# Patient Record
Sex: Female | Born: 1935 | Race: White | Hispanic: No | Marital: Married | Smoking: Never smoker
Health system: Southern US, Community
[De-identification: ages and names within clinical notes are randomized; demographics above are authoritative.]

## PROBLEM LIST (undated history)

## (undated) DIAGNOSIS — N6019 Diffuse cystic mastopathy of unspecified breast: Secondary | ICD-10-CM

## (undated) DIAGNOSIS — E785 Hyperlipidemia, unspecified: Secondary | ICD-10-CM

## (undated) DIAGNOSIS — H3320 Serous retinal detachment, unspecified eye: Secondary | ICD-10-CM

## (undated) DIAGNOSIS — H3530 Unspecified macular degeneration: Secondary | ICD-10-CM

## (undated) DIAGNOSIS — M810 Age-related osteoporosis without current pathological fracture: Secondary | ICD-10-CM

## (undated) DIAGNOSIS — M1990 Unspecified osteoarthritis, unspecified site: Secondary | ICD-10-CM

## (undated) DIAGNOSIS — I2510 Atherosclerotic heart disease of native coronary artery without angina pectoris: Secondary | ICD-10-CM

## (undated) DIAGNOSIS — K219 Gastro-esophageal reflux disease without esophagitis: Secondary | ICD-10-CM

## (undated) HISTORY — PX: HIP SURGERY: SHX245

## (undated) HISTORY — PX: CORONARY ARTERY BYPASS GRAFT: SHX141

## (undated) HISTORY — PX: CATARACT EXTRACTION, BILATERAL: SHX1313

---

## 1968-05-20 HISTORY — PX: BREAST EXCISIONAL BIOPSY: SUR124

## 2004-03-28 ENCOUNTER — Ambulatory Visit: Payer: Self-pay | Admitting: Unknown Physician Specialty

## 2004-11-09 ENCOUNTER — Ambulatory Visit: Payer: Self-pay | Admitting: Obstetrics and Gynecology

## 2006-05-21 ENCOUNTER — Ambulatory Visit: Payer: Self-pay | Admitting: Obstetrics and Gynecology

## 2007-06-11 ENCOUNTER — Ambulatory Visit: Payer: Self-pay | Admitting: Obstetrics and Gynecology

## 2007-08-03 ENCOUNTER — Ambulatory Visit: Payer: Self-pay

## 2007-08-11 ENCOUNTER — Ambulatory Visit: Payer: Self-pay

## 2008-06-14 ENCOUNTER — Ambulatory Visit: Payer: Self-pay | Admitting: Obstetrics and Gynecology

## 2008-08-02 ENCOUNTER — Ambulatory Visit: Payer: Self-pay | Admitting: Unknown Physician Specialty

## 2009-06-26 ENCOUNTER — Ambulatory Visit: Payer: Self-pay | Admitting: Obstetrics and Gynecology

## 2009-08-11 ENCOUNTER — Ambulatory Visit: Payer: Self-pay | Admitting: Otolaryngology

## 2010-06-27 ENCOUNTER — Ambulatory Visit: Payer: Self-pay | Admitting: Obstetrics and Gynecology

## 2011-03-11 ENCOUNTER — Encounter (INDEPENDENT_AMBULATORY_CARE_PROVIDER_SITE_OTHER): Payer: Medicare Other | Admitting: Ophthalmology

## 2011-03-11 DIAGNOSIS — H3530 Unspecified macular degeneration: Secondary | ICD-10-CM

## 2011-03-11 DIAGNOSIS — H33009 Unspecified retinal detachment with retinal break, unspecified eye: Secondary | ICD-10-CM

## 2011-03-11 DIAGNOSIS — H43819 Vitreous degeneration, unspecified eye: Secondary | ICD-10-CM

## 2011-03-11 DIAGNOSIS — H33309 Unspecified retinal break, unspecified eye: Secondary | ICD-10-CM

## 2011-07-17 ENCOUNTER — Ambulatory Visit: Payer: Self-pay | Admitting: Obstetrics and Gynecology

## 2011-08-05 ENCOUNTER — Ambulatory Visit: Payer: Self-pay | Admitting: Family Medicine

## 2012-03-11 ENCOUNTER — Encounter (INDEPENDENT_AMBULATORY_CARE_PROVIDER_SITE_OTHER): Payer: Medicare Other | Admitting: Ophthalmology

## 2012-03-11 DIAGNOSIS — H43819 Vitreous degeneration, unspecified eye: Secondary | ICD-10-CM

## 2012-03-11 DIAGNOSIS — H33309 Unspecified retinal break, unspecified eye: Secondary | ICD-10-CM

## 2012-03-11 DIAGNOSIS — H3530 Unspecified macular degeneration: Secondary | ICD-10-CM

## 2012-03-11 DIAGNOSIS — H33009 Unspecified retinal detachment with retinal break, unspecified eye: Secondary | ICD-10-CM

## 2012-07-21 ENCOUNTER — Ambulatory Visit: Payer: Self-pay | Admitting: Obstetrics and Gynecology

## 2013-03-11 ENCOUNTER — Ambulatory Visit (INDEPENDENT_AMBULATORY_CARE_PROVIDER_SITE_OTHER): Payer: Medicare Other | Admitting: Ophthalmology

## 2013-03-12 ENCOUNTER — Ambulatory Visit (INDEPENDENT_AMBULATORY_CARE_PROVIDER_SITE_OTHER): Payer: Medicare Other | Admitting: Ophthalmology

## 2013-03-12 DIAGNOSIS — H33009 Unspecified retinal detachment with retinal break, unspecified eye: Secondary | ICD-10-CM

## 2013-03-12 DIAGNOSIS — H35379 Puckering of macula, unspecified eye: Secondary | ICD-10-CM

## 2013-03-12 DIAGNOSIS — H43819 Vitreous degeneration, unspecified eye: Secondary | ICD-10-CM

## 2013-03-12 DIAGNOSIS — H33309 Unspecified retinal break, unspecified eye: Secondary | ICD-10-CM

## 2013-03-12 DIAGNOSIS — H3530 Unspecified macular degeneration: Secondary | ICD-10-CM

## 2013-08-02 ENCOUNTER — Ambulatory Visit: Payer: Self-pay | Admitting: Obstetrics and Gynecology

## 2014-03-21 ENCOUNTER — Ambulatory Visit (INDEPENDENT_AMBULATORY_CARE_PROVIDER_SITE_OTHER): Payer: Medicare Other | Admitting: Ophthalmology

## 2014-03-21 DIAGNOSIS — H43813 Vitreous degeneration, bilateral: Secondary | ICD-10-CM

## 2014-03-21 DIAGNOSIS — H3531 Nonexudative age-related macular degeneration: Secondary | ICD-10-CM

## 2014-03-21 DIAGNOSIS — H35371 Puckering of macula, right eye: Secondary | ICD-10-CM

## 2014-08-24 ENCOUNTER — Ambulatory Visit
Admit: 2014-08-24 | Disposition: A | Discharge status: Home / Self Care | Payer: Self-pay | Attending: Family Medicine | Admitting: Family Medicine

## 2014-09-15 ENCOUNTER — Ambulatory Visit
Admit: 2014-09-15 | Disposition: A | Discharge status: Home / Self Care | Payer: Self-pay | Attending: Family Medicine | Admitting: Family Medicine

## 2015-01-02 ENCOUNTER — Other Ambulatory Visit: Payer: Self-pay | Admitting: Neurology

## 2015-01-02 DIAGNOSIS — R51 Headache: Principal | ICD-10-CM

## 2015-01-02 DIAGNOSIS — R519 Headache, unspecified: Secondary | ICD-10-CM

## 2015-01-06 ENCOUNTER — Ambulatory Visit: Payer: Medicare Other

## 2015-01-17 ENCOUNTER — Ambulatory Visit
Admission: RE | Admit: 2015-01-17 | Discharge: 2015-01-17 | Disposition: A | Discharge status: Home / Self Care | Payer: Medicare Other | Source: Ambulatory Visit | Attending: Neurology | Admitting: Neurology

## 2015-01-17 DIAGNOSIS — R519 Headache, unspecified: Secondary | ICD-10-CM

## 2015-01-17 DIAGNOSIS — R51 Headache: Secondary | ICD-10-CM | POA: Insufficient documentation

## 2015-01-17 DIAGNOSIS — R55 Syncope and collapse: Secondary | ICD-10-CM | POA: Insufficient documentation

## 2015-01-17 DIAGNOSIS — I739 Peripheral vascular disease, unspecified: Secondary | ICD-10-CM | POA: Diagnosis not present

## 2015-01-17 DIAGNOSIS — R42 Dizziness and giddiness: Secondary | ICD-10-CM | POA: Insufficient documentation

## 2015-03-29 ENCOUNTER — Ambulatory Visit (INDEPENDENT_AMBULATORY_CARE_PROVIDER_SITE_OTHER): Payer: Medicare Other | Admitting: Ophthalmology

## 2015-03-29 DIAGNOSIS — H353122 Nonexudative age-related macular degeneration, left eye, intermediate dry stage: Secondary | ICD-10-CM

## 2015-03-29 DIAGNOSIS — H33301 Unspecified retinal break, right eye: Secondary | ICD-10-CM

## 2015-03-29 DIAGNOSIS — H338 Other retinal detachments: Secondary | ICD-10-CM

## 2015-03-29 DIAGNOSIS — H35371 Puckering of macula, right eye: Secondary | ICD-10-CM | POA: Diagnosis not present

## 2015-03-29 DIAGNOSIS — H43813 Vitreous degeneration, bilateral: Secondary | ICD-10-CM | POA: Diagnosis not present

## 2015-08-29 ENCOUNTER — Other Ambulatory Visit: Payer: Self-pay | Admitting: Family Medicine

## 2015-08-29 DIAGNOSIS — Z1231 Encounter for screening mammogram for malignant neoplasm of breast: Secondary | ICD-10-CM

## 2015-09-07 ENCOUNTER — Ambulatory Visit
Admission: RE | Admit: 2015-09-07 | Discharge: 2015-09-07 | Disposition: A | Discharge status: Home / Self Care | Payer: Medicare Other | Source: Ambulatory Visit | Attending: Family Medicine | Admitting: Family Medicine

## 2015-09-07 ENCOUNTER — Other Ambulatory Visit: Payer: Self-pay | Admitting: Family Medicine

## 2015-09-07 DIAGNOSIS — Z1231 Encounter for screening mammogram for malignant neoplasm of breast: Secondary | ICD-10-CM

## 2016-04-03 ENCOUNTER — Ambulatory Visit (INDEPENDENT_AMBULATORY_CARE_PROVIDER_SITE_OTHER): Payer: Medicare Other | Admitting: Ophthalmology

## 2016-04-03 DIAGNOSIS — H353122 Nonexudative age-related macular degeneration, left eye, intermediate dry stage: Secondary | ICD-10-CM | POA: Diagnosis not present

## 2016-04-03 DIAGNOSIS — H43813 Vitreous degeneration, bilateral: Secondary | ICD-10-CM

## 2016-04-03 DIAGNOSIS — H33301 Unspecified retinal break, right eye: Secondary | ICD-10-CM | POA: Diagnosis not present

## 2016-04-03 DIAGNOSIS — H35371 Puckering of macula, right eye: Secondary | ICD-10-CM | POA: Diagnosis not present

## 2016-04-03 DIAGNOSIS — H338 Other retinal detachments: Secondary | ICD-10-CM

## 2016-06-14 NOTE — Progress Notes (Signed)
Scheduling pre op- please add SURGICAL ORDERS IN EPIC  thanks 

## 2016-06-19 ENCOUNTER — Encounter (HOSPITAL_COMMUNITY): Payer: Self-pay

## 2016-06-20 ENCOUNTER — Other Ambulatory Visit (HOSPITAL_COMMUNITY): Payer: Self-pay | Admitting: Emergency Medicine

## 2016-06-24 ENCOUNTER — Encounter (HOSPITAL_COMMUNITY)
Admission: RE | Admit: 2016-06-24 | Discharge: 2016-06-24 | Disposition: A | Discharge status: Home / Self Care | Payer: Medicare Other | Source: Ambulatory Visit | Attending: Orthopedic Surgery | Admitting: Orthopedic Surgery

## 2016-06-24 ENCOUNTER — Encounter (HOSPITAL_COMMUNITY): Payer: Self-pay

## 2016-06-24 DIAGNOSIS — Z01812 Encounter for preprocedural laboratory examination: Secondary | ICD-10-CM | POA: Diagnosis present

## 2016-06-24 HISTORY — DX: Serous retinal detachment, unspecified eye: H33.20

## 2016-06-24 HISTORY — DX: Unspecified osteoarthritis, unspecified site: M19.90

## 2016-06-24 HISTORY — DX: Unspecified macular degeneration: H35.30

## 2016-06-24 LAB — SURGICAL PCR SCREEN
MRSA, PCR: NEGATIVE
Staphylococcus aureus: NEGATIVE

## 2016-06-24 LAB — CBC
HEMATOCRIT: 37.7 % (ref 36.0–46.0)
Hemoglobin: 12.3 g/dL (ref 12.0–15.0)
MCH: 28.5 pg (ref 26.0–34.0)
MCHC: 32.6 g/dL (ref 30.0–36.0)
MCV: 87.3 fL (ref 78.0–100.0)
Platelets: 266 10*3/uL (ref 150–400)
RBC: 4.32 MIL/uL (ref 3.87–5.11)
RDW: 12.7 % (ref 11.5–15.5)
WBC: 4.7 10*3/uL (ref 4.0–10.5)

## 2016-06-24 LAB — ABO/RH: ABO/RH(D): B POS

## 2016-06-24 NOTE — Progress Notes (Addendum)
At pre-op patient reports she has not talked with surgeon or PA about her surgery and will "swing by his office after this" to do so. She will need to sign her consent D.O.S

## 2016-06-24 NOTE — Patient Instructions (Signed)
Darlene Barry Darlene Barry  06/24/2016   Your procedure is scheduled on: 07-02-16  [facility]  [facility] to 3rd floor to  Short Stay Center at [phone].  Call this number if you have problems the morning of surgery [phone]   Remember: ONLY 1 PERSON MAY GO WITH YOU TO SHORT STAY TO GET  READY MORNING OF YOUR SURGERY.  Do not eat food or drink liquids :After Midnight.     Take these medicines the morning of surgery with A SIP OF WATER: tylenol as needed, gabapentin(Neurontin)                                You may not have any metal on your body including hair pins and              piercings  Do not wear jewelry, make-up, lotions, powders or perfumes, deodorant             Do not wear nail polish.  Do not shave  48 hours prior to surgery.              Men may shave face and neck.   Do not bring valuables to the hospital. [facility] IS NOT             RESPONSIBLE   FOR VALUABLES.  Contacts, dentures or bridgework may not be worn into surgery.  Leave suitcase in the car. After surgery it may be brought to your room.              Please read over the following fact sheets you were given: _____________________________________________________________________             [facility] Pre[facility] Before surgery, you can play an important role.  Because skin is not sterile, your skin needs to be as free of germs as possible.  You can reduce the number of germs on your skin by washing with CHG (chlorahexidine gluconate) soap before surgery.  CHG is an antiseptic cleaner which kills germs and bonds with the skin to continue killing germs even after washing. Please DO NOT use if you have an allergy to CHG or antibacterial soaps.  If your skin becomes reddened/irritated stop using the CHG and inform your nurse when you arrive at Short Stay. Do not shave (including legs and underarms) for at least 48 hours prior to the first CHG shower.   You may shave your face/neck. Please follow these instructions carefully:  1.  Shower with CHG Soap the night before surgery and the  morning of Surgery.  2.  If you choose to wash your hair, wash your hair first as usual with your  normal  shampoo.  3.  After you shampoo, rinse your hair and body thoroughly to remove the  shampoo.                           4.  Use CHG as you would any other liquid soap.  You can apply chg directly  to the skin and wash                       Gently with a scrungie or clean washcloth.  5.  Apply the CHG Soap to your  body ONLY FROM THE NECK DOWN.   Do not use on face/ open                           Wound or open sores. Avoid contact with eyes, ears mouth and genitals (private parts).                       Wash face,  Genitals (private parts) with your normal soap.             6.  Wash thoroughly, paying special attention to the area where your surgery  will be performed.  7.  Thoroughly rinse your body with warm water from the neck down.  8.  DO NOT shower/wash with your normal soap after using and rinsing off  the CHG Soap.                9.  Pat yourself dry with a clean towel.            10.  Wear clean pajamas.            11.  Place clean sheets on your bed the night of your first shower and do not  sleep with pets. Day of Surgery : Do not apply any lotions/deodorants the morning of surgery.  Please wear clean clothes to the hospital/surgery center.  FAILURE TO FOLLOW THESE INSTRUCTIONS MAY RESULT IN THE CANCELLATION OF YOUR SURGERY PATIENT SIGNATURE_________________________________  NURSE SIGNATURE__________________________________  ________________________________________________________________________   Darlene Barry  An incentive spirometer is a tool that can help keep your lungs clear and active. This tool measures how well you are filling your lungs with each breath. Taking long deep breaths may help reverse or decrease the chance of  developing breathing (pulmonary) problems (especially infection) following:  A long period of time when you are unable to move or be active. BEFORE THE PROCEDURE   If the spirometer includes an indicator to show your best effort, your nurse or respiratory therapist will set it to a desired goal.  If possible, sit up straight or lean slightly forward. Try not to slouch.  Hold the incentive spirometer in an upright position. INSTRUCTIONS FOR USE  1. Sit on the edge of your bed if possible, or sit up as far as you can in bed or on a chair. 2. Hold the incentive spirometer in an upright position. 3. Breathe out normally. 4. Place the mouthpiece in your mouth and seal your lips tightly around it. 5. Breathe in slowly and as deeply as possible, raising the piston or the ball toward the top of the column. 6. Hold your breath for 3-5 seconds or for as long as possible. Allow the piston or ball to fall to the bottom of the column. 7. Remove the mouthpiece from your mouth and breathe out normally. 8. Rest for a few seconds and repeat Steps 1 through 7 at least 10 times every 1-2 hours when you are awake. Take your time and take a few normal breaths between deep breaths. 9. The spirometer may include an indicator to show your best effort. Use the indicator as a goal to work toward during each repetition. 10. After each set of 10 deep breaths, practice coughing to be sure your lungs are clear. If you have an incision (the cut made at the time of surgery), support your incision when coughing by placing a pillow or rolled up towels firmly against it.  Once you are able to get out of bed, walk around indoors and cough well. You may stop using the incentive spirometer when instructed by your caregiver.  RISKS AND COMPLICATIONS  Take your time so you do not get dizzy or light-headed.  If you are in pain, you may need to take or ask for pain medication before doing incentive spirometry. It is harder to take a  deep breath if you are having pain. AFTER USE  Rest and breathe slowly and easily.  It can be helpful to keep track of a log of your progress. Your caregiver can provide you with a simple table to help with this. If you are using the spirometer at home, follow these instructions: [place] IF:   You are having difficultly using the spirometer.  You have trouble using the spirometer as often as instructed.  Your pain medication is not giving enough relief while using the spirometer.  You develop fever of 100.5 F (38.1 C) or higher. SEEK IMMEDIATE MEDICAL CARE IF:   You cough up bloody sputum that had not been present before.  You develop fever of 102 F (38.9 C) or greater.  You develop worsening pain at or near the incision site. MAKE SURE YOU:   Understand these instructions.  Will watch your condition.  Will get help right away if you are not doing well or get worse. Document Released: 09/16/2006 Document Revised: 07/29/2011 Document Reviewed: 11/17/2006 Exit[employer] Patient Information 2014 Exit[employer], [place].   ________________________________________________________________________  WHAT IS A BLOOD TRANSFUSION? Blood Transfusion Information  A transfusion is the replacement of blood or some of its parts. Blood is made up of multiple cells which provide different functions.  Red blood cells carry oxygen and are used for blood loss replacement.  White blood cells fight against infection.  Platelets control bleeding.  Plasma helps clot blood.  Other blood products are available for specialized needs, such as hemophilia or other clotting disorders. BEFORE THE TRANSFUSION  Who gives blood for transfusions?   Healthy volunteers who are fully evaluated to make sure their blood is safe. This is blood bank blood. Transfusion therapy is the safest it has ever been in the practice of medicine. Before blood is taken from a donor, a complete history is taken to make  sure that person has no history of diseases nor engages in risky social behavior (examples are intravenous drug use or sexual activity with multiple partners). The donor's travel history is screened to minimize risk of transmitting infections, such as malaria. The donated blood is tested for signs of infectious diseases, such as HIV and hepatitis. The blood is then tested to be sure it is compatible with you in order to minimize the chance of a transfusion reaction. If you or a relative donates blood, this is often done in anticipation of surgery and is not appropriate for emergency situations. It takes many days to process the donated blood. RISKS AND COMPLICATIONS Although transfusion therapy is very safe and saves many lives, the main dangers of transfusion include:   Getting an infectious disease.  Developing a transfusion reaction. This is an allergic reaction to something in the blood you were given. Every precaution is taken to prevent this. The decision to have a blood transfusion has been considered carefully by your caregiver before blood is given. Blood is not given unless the benefits outweigh the risks. AFTER THE TRANSFUSION  Right after receiving a blood transfusion, you will usually feel much better and more energetic.  This is especially true if your red blood cells have gotten low (anemic). The transfusion raises the level of the red blood cells which carry oxygen, and this usually causes an energy increase.  The nurse administering the transfusion will monitor you carefully for complications. HOME CARE INSTRUCTIONS  No special instructions are needed after a transfusion. You may find your energy is better. Speak with your caregiver about any limitations on activity for underlying diseases you may have. SEEK MEDICAL CARE IF:   Your condition is not improving after your transfusion.  You develop redness or irritation at the intravenous (IV) site. SEEK IMMEDIATE MEDICAL CARE IF:   Any of the following symptoms occur over the next 12 hours:  Shaking chills.  You have a temperature by mouth above 102 F (38.9 C), not controlled by medicine.  Chest, back, or muscle pain.  People around you feel you are not acting correctly or are confused.  Shortness of breath or difficulty breathing.  Dizziness and fainting.  You get a rash or develop hives.  You have a decrease in urine output.  Your urine turns a dark color or changes to pink, red, or brown. Any of the following symptoms occur over the next 10 days:  You have a temperature by mouth above 102 F (38.9 C), not controlled by medicine.  Shortness of breath.  Weakness after normal activity.  The white part of the eye turns yellow (jaundice).  You have a decrease in the amount of urine or are urinating less often.  Your urine turns a dark color or changes to pink, red, or brown. Document Released: 05/03/2000 Document Revised: 07/29/2011 Document Reviewed: 12/21/2007 [facility] Patient Information 2014 [place], [place].  _______________________________________________________________________

## 2016-06-27 NOTE — H&P (Signed)
TOTAL HIP ADMISSION H&P  Patient is admitted for left total hip arthroplasty, anterior approach.  Subjective:  Chief Complaint:    Left hip primary OA / pain  HPI: Darlene Barry, 83 y.o. female, has a history of pain and functional disability in the left hip(s) due to arthritis and patient has failed non-surgical conservative treatments for greater than 12 weeks to include NSAID's and/or analgesics and activity modification.  Onset of symptoms was gradual starting  years ago with gradually worsening course since that time.The patient noted no past surgery on the left hip(s).  Patient currently rates pain in the left hip at 9 out of 10 with activity. Patient has worsening of pain with activity and weight bearing, trendelenberg gait, pain that interfers with activities of daily living and pain with passive range of motion. Patient has evidence of periarticular osteophytes and joint space narrowing by imaging studies. This condition presents safety issues increasing the risk of falls. There is no current active infection.   Risks, benefits and expectations were discussed with the patient.  Risks including but not limited to the risk of anesthesia, blood clots, nerve damage, blood vessel damage, failure of the prosthesis, infection and up to and including death.  Patient understand the risks, benefits and expectations and wishes to proceed with surgery.    PCP: Jerl Mina, MD  D/C Plans:       Home   Post-op Meds:       No Rx given   Tranexamic Acid:      To be given - IV   Decadron:      Is to be given  FYI:     ASA  Norco    Past Medical History:  Diagnosis Date  . Arthritis   . Dementia   . Detached retina   . Macular degeneration     Past Surgical History:  Procedure Laterality Date  . BREAST EXCISIONAL BIOPSY Right 1970   NEG  . CATARACT EXTRACTION, BILATERAL    . HIP SURGERY     fractured hip, has screws in place    No prescriptions prior to admission.   Allergies   Allergen Reactions  . Sulfa Antibiotics Other (See Comments)    Social History  Substance Use Topics  . Smoking status: Never Smoker  . Smokeless tobacco: Never Used  . Alcohol use No    Family History  Problem Relation Age of Onset  . Breast cancer Daughter 32  . Breast cancer Paternal Aunt   . Breast cancer Paternal Aunt      Review of Systems  Constitutional: Negative.   HENT: Negative.   Eyes: Negative.   Respiratory: Negative.   Cardiovascular: Negative.   Gastrointestinal: Negative.   Genitourinary: Negative.   Musculoskeletal: Positive for joint pain.  Skin: Negative.   Neurological: Negative.   Endo/Heme/Allergies: Negative.   Psychiatric/Behavioral: Positive for memory loss.    Objective:  Physical Exam  Constitutional: She is oriented to person, place, and time. She appears well-developed.  HENT:  Head: Normocephalic.  Eyes: Pupils are equal, round, and reactive to light.  Neck: Neck supple. No JVD present. No tracheal deviation present. No thyromegaly present.  Cardiovascular: Normal rate, regular rhythm and intact distal pulses.   Respiratory: Effort normal and breath sounds normal. No respiratory distress. She has no wheezes.  GI: Soft. There is no tenderness. There is no guarding.  Musculoskeletal:       Left hip: She exhibits decreased range of motion, decreased strength, tenderness  and bony tenderness. She exhibits no swelling, no deformity and no laceration.  Lymphadenopathy:    She has no cervical adenopathy.  Neurological: She is alert and oriented to person, place, and time.  Skin: Skin is warm and dry.  Psychiatric: She has a normal mood and affect.       Labs:  Estimated body mass index is 22.64 kg/m as calculated from the following:   Height as of 06/24/16: 5\' 3"  (1.6 m).   Weight as of 06/24/16: 58 kg (127 lb 12.8 oz).   Imaging Review Plain radiographs demonstrate severe degenerative joint disease of the left hip(s). The bone  quality appears to be good for age and reported activity level.  Assessment/Plan:  End stage arthritis, left hip(s)  The patient history, physical examination, clinical judgement of the provider and imaging studies are consistent with end stage degenerative joint disease of the left hip(s) and total hip arthroplasty is deemed medically necessary. The treatment options including medical management, injection therapy, arthroscopy and arthroplasty were discussed at length. The risks and benefits of total hip arthroplasty were presented and reviewed. The risks due to aseptic loosening, infection, stiffness, dislocation/subluxation,  thromboembolic complications and other imponderables were discussed.  The patient acknowledged the explanation, agreed to proceed with the plan and consent was signed. Patient is being admitted for inpatient treatment for surgery, pain control, PT, OT, prophylactic antibiotics, VTE prophylaxis, progressive ambulation and ADL's and discharge planning.The patient is planning to be discharged home.     Anastasio AuerbachMatthew S. Domonique Cothran   PA-C  06/27/2016, 11:33 AM

## 2016-07-02 ENCOUNTER — Encounter (HOSPITAL_COMMUNITY)
Admission: RE | Disposition: A | Discharge status: Home / Self Care | Payer: Self-pay | Source: Ambulatory Visit | Attending: Orthopedic Surgery

## 2016-07-02 ENCOUNTER — Inpatient Hospital Stay (HOSPITAL_COMMUNITY): Payer: Medicare Other | Admitting: Certified Registered Nurse Anesthetist

## 2016-07-02 ENCOUNTER — Inpatient Hospital Stay (HOSPITAL_COMMUNITY): Payer: Medicare Other

## 2016-07-02 ENCOUNTER — Encounter (HOSPITAL_COMMUNITY): Payer: Self-pay | Admitting: *Deleted

## 2016-07-02 ENCOUNTER — Inpatient Hospital Stay (HOSPITAL_COMMUNITY)
Admission: RE | Admit: 2016-07-02 | Discharge: 2016-07-04 | DRG: 470 | Disposition: A | Discharge status: Home / Self Care | Payer: Medicare Other | Source: Ambulatory Visit | Attending: Orthopedic Surgery | Admitting: Orthopedic Surgery

## 2016-07-02 DIAGNOSIS — Z882 Allergy status to sulfonamides status: Secondary | ICD-10-CM

## 2016-07-02 DIAGNOSIS — F0390 Unspecified dementia without behavioral disturbance: Secondary | ICD-10-CM | POA: Diagnosis present

## 2016-07-02 DIAGNOSIS — R110 Nausea: Secondary | ICD-10-CM | POA: Diagnosis not present

## 2016-07-02 DIAGNOSIS — Z96649 Presence of unspecified artificial hip joint: Secondary | ICD-10-CM

## 2016-07-02 DIAGNOSIS — M1612 Unilateral primary osteoarthritis, left hip: Principal | ICD-10-CM | POA: Diagnosis present

## 2016-07-02 DIAGNOSIS — H3530 Unspecified macular degeneration: Secondary | ICD-10-CM | POA: Diagnosis present

## 2016-07-02 DIAGNOSIS — Z96642 Presence of left artificial hip joint: Secondary | ICD-10-CM

## 2016-07-02 HISTORY — PX: TOTAL HIP ARTHROPLASTY: SHX124

## 2016-07-02 LAB — TYPE AND SCREEN
ABO/RH(D): B POS
ANTIBODY SCREEN: NEGATIVE

## 2016-07-02 SURGERY — ARTHROPLASTY, HIP, TOTAL, ANTERIOR APPROACH
Anesthesia: Spinal | Site: Hip | Laterality: Left

## 2016-07-02 MED ORDER — CHLORHEXIDINE GLUCONATE 4 % EX LIQD: 60.0000 mL | Freq: Once | CUTANEOUS | Status: DC

## 2016-07-02 MED ORDER — BUPIVACAINE HCL (PF) 0.75 % IJ SOLN
INTRAMUSCULAR | Status: DC | PRN
  Administered 2016-07-02: 2 mL

## 2016-07-02 MED ORDER — MENTHOL 3 MG MT LOZG: 1.0000 | LOZENGE | OROMUCOSAL | Status: DC | PRN

## 2016-07-02 MED ORDER — FENTANYL CITRATE (PF) 100 MCG/2ML IJ SOLN
25.0000 ug | INTRAMUSCULAR | Status: DC | PRN
  Administered 2016-07-02 (×2): 50 ug via INTRAVENOUS

## 2016-07-02 MED ORDER — SODIUM CHLORIDE 0.9 % IV SOLN
100.0000 mL/h | INTRAVENOUS | Status: DC
  Administered 2016-07-02 – 2016-07-03 (×2): 100 mL/h via INTRAVENOUS
  Filled 2016-07-02 (×7): qty 1000

## 2016-07-02 MED ORDER — FENTANYL CITRATE (PF) 100 MCG/2ML IJ SOLN
INTRAMUSCULAR | Status: AC
  Administered 2016-07-02: 50 ug via INTRAVENOUS
  Filled 2016-07-02: qty 2

## 2016-07-02 MED ORDER — METOCLOPRAMIDE HCL 5 MG/ML IJ SOLN
5.0000 mg | Freq: Three times a day (TID) | INTRAMUSCULAR | Status: DC | PRN
  Administered 2016-07-02: 16:00:00 10 mg via INTRAVENOUS
  Filled 2016-07-02: qty 2

## 2016-07-02 MED ORDER — ONDANSETRON HCL 4 MG PO TABS
4.0000 mg | ORAL_TABLET | Freq: Four times a day (QID) | ORAL | Status: DC | PRN
  Administered 2016-07-03 (×2): 4 mg via ORAL
  Filled 2016-07-02 (×2): qty 1

## 2016-07-02 MED ORDER — DEXAMETHASONE SODIUM PHOSPHATE 10 MG/ML IJ SOLN
10.0000 mg | Freq: Once | INTRAMUSCULAR | Status: AC
  Administered 2016-07-03: 10 mg via INTRAVENOUS
  Filled 2016-07-02: qty 1

## 2016-07-02 MED ORDER — PROPOFOL 500 MG/50ML IV EMUL
INTRAVENOUS | Status: DC | PRN
  Administered 2016-07-02: 80 ug/kg/min via INTRAVENOUS

## 2016-07-02 MED ORDER — ASPIRIN 81 MG PO CHEW
81.0000 mg | CHEWABLE_TABLET | Freq: Two times a day (BID) | ORAL | Status: DC
  Administered 2016-07-03 – 2016-07-04 (×3): 81 mg via ORAL
  Filled 2016-07-02 (×3): qty 1

## 2016-07-02 MED ORDER — DIPHENHYDRAMINE HCL 25 MG PO CAPS: 25.0000 mg | ORAL_CAPSULE | Freq: Four times a day (QID) | ORAL | Status: DC | PRN

## 2016-07-02 MED ORDER — PROPOFOL 10 MG/ML IV BOLUS
INTRAVENOUS | Status: AC
  Filled 2016-07-02: qty 60

## 2016-07-02 MED ORDER — ALUM & MAG HYDROXIDE-SIMETH 200-200-20 MG/5ML PO SUSP: 30.0000 mL | ORAL | Status: DC | PRN

## 2016-07-02 MED ORDER — PROPOFOL 500 MG/50ML IV EMUL
INTRAVENOUS | Status: DC | PRN
  Administered 2016-07-02: 40 mg via INTRAVENOUS

## 2016-07-02 MED ORDER — LACTATED RINGERS IV SOLN
INTRAVENOUS | Status: DC
  Administered 2016-07-02 (×3): via INTRAVENOUS

## 2016-07-02 MED ORDER — MAGNESIUM CITRATE PO SOLN: 1.0000 | Freq: Once | ORAL | Status: DC | PRN

## 2016-07-02 MED ORDER — METHOCARBAMOL 500 MG PO TABS: 500.0000 mg | ORAL_TABLET | Freq: Four times a day (QID) | ORAL | Status: DC | PRN

## 2016-07-02 MED ORDER — DEXAMETHASONE SODIUM PHOSPHATE 10 MG/ML IJ SOLN
INTRAMUSCULAR | Status: AC
  Filled 2016-07-02: qty 1

## 2016-07-02 MED ORDER — CEFAZOLIN SODIUM-DEXTROSE 2-4 GM/100ML-% IV SOLN
2.0000 g | Freq: Four times a day (QID) | INTRAVENOUS | Status: AC
  Administered 2016-07-02 – 2016-07-03 (×2): 2 g via INTRAVENOUS
  Filled 2016-07-02 (×2): qty 100

## 2016-07-02 MED ORDER — STERILE WATER FOR IRRIGATION IR SOLN
Status: DC | PRN
  Administered 2016-07-02: 2000 mL

## 2016-07-02 MED ORDER — GABAPENTIN 100 MG PO CAPS
200.0000 mg | ORAL_CAPSULE | Freq: Three times a day (TID) | ORAL | Status: DC
  Administered 2016-07-03 – 2016-07-04 (×5): 200 mg via ORAL
  Filled 2016-07-02 (×6): qty 2

## 2016-07-02 MED ORDER — HYDROMORPHONE HCL 1 MG/ML IJ SOLN: 0.2500 mg | INTRAMUSCULAR | Status: DC | PRN

## 2016-07-02 MED ORDER — HYDROCODONE-ACETAMINOPHEN 7.5-325 MG PO TABS: 1.0000 | ORAL_TABLET | ORAL | Status: DC

## 2016-07-02 MED ORDER — HYDROMORPHONE HCL 1 MG/ML IJ SOLN
0.5000 mg | INTRAMUSCULAR | Status: DC | PRN
  Administered 2016-07-02: 17:00:00 0.5 mg via INTRAVENOUS
  Filled 2016-07-02: qty 1

## 2016-07-02 MED ORDER — ONDANSETRON HCL 4 MG/2ML IJ SOLN
4.0000 mg | Freq: Four times a day (QID) | INTRAMUSCULAR | Status: DC | PRN
  Administered 2016-07-02: 4 mg via INTRAVENOUS
  Filled 2016-07-02: qty 2

## 2016-07-02 MED ORDER — METHOCARBAMOL 1000 MG/10ML IJ SOLN
500.0000 mg | Freq: Four times a day (QID) | INTRAVENOUS | Status: DC | PRN
  Administered 2016-07-02: 500 mg via INTRAVENOUS
  Filled 2016-07-02: qty 5
  Filled 2016-07-02: qty 550

## 2016-07-02 MED ORDER — TRANEXAMIC ACID 1000 MG/10ML IV SOLN
1000.0000 mg | INTRAVENOUS | Status: AC
  Administered 2016-07-02: 1000 mg via INTRAVENOUS
  Filled 2016-07-02: qty 10

## 2016-07-02 MED ORDER — CEFAZOLIN SODIUM-DEXTROSE 2-4 GM/100ML-% IV SOLN
INTRAVENOUS | Status: AC
  Filled 2016-07-02: qty 100

## 2016-07-02 MED ORDER — DEXAMETHASONE SODIUM PHOSPHATE 10 MG/ML IJ SOLN
10.0000 mg | Freq: Once | INTRAMUSCULAR | Status: AC
  Administered 2016-07-02: 10 mg via INTRAVENOUS

## 2016-07-02 MED ORDER — METOCLOPRAMIDE HCL 5 MG PO TABS: 5.0000 mg | ORAL_TABLET | Freq: Three times a day (TID) | ORAL | Status: DC | PRN

## 2016-07-02 MED ORDER — ESOMEPRAZOLE MAGNESIUM 40 MG PO CPDR
40.0000 mg | DELAYED_RELEASE_CAPSULE | Freq: Two times a day (BID) | ORAL | Status: DC
  Administered 2016-07-03 – 2016-07-04 (×3): 40 mg via ORAL
  Filled 2016-07-02 (×7): qty 1

## 2016-07-02 MED ORDER — BISACODYL 10 MG RE SUPP: 10.0000 mg | Freq: Every day | RECTAL | Status: DC | PRN

## 2016-07-02 MED ORDER — DOCUSATE SODIUM 100 MG PO CAPS
100.0000 mg | ORAL_CAPSULE | Freq: Two times a day (BID) | ORAL | Status: DC
  Administered 2016-07-03 – 2016-07-04 (×3): 100 mg via ORAL
  Filled 2016-07-02 (×4): qty 1

## 2016-07-02 MED ORDER — PHENOL 1.4 % MT LIQD: 1.0000 | OROMUCOSAL | Status: DC | PRN

## 2016-07-02 MED ORDER — FERROUS SULFATE 325 (65 FE) MG PO TABS
325.0000 mg | ORAL_TABLET | Freq: Three times a day (TID) | ORAL | Status: DC
  Administered 2016-07-04: 325 mg via ORAL
  Filled 2016-07-02: qty 1

## 2016-07-02 MED ORDER — FENTANYL CITRATE (PF) 100 MCG/2ML IJ SOLN
INTRAMUSCULAR | Status: DC | PRN
  Administered 2016-07-02: 100 ug via INTRAVENOUS

## 2016-07-02 MED ORDER — POLYETHYLENE GLYCOL 3350 17 G PO PACK
17.0000 g | PACK | Freq: Two times a day (BID) | ORAL | Status: DC
  Administered 2016-07-03 – 2016-07-04 (×2): 17 g via ORAL
  Filled 2016-07-02 (×2): qty 1

## 2016-07-02 MED ORDER — CEFAZOLIN SODIUM-DEXTROSE 2-4 GM/100ML-% IV SOLN
2.0000 g | INTRAVENOUS | Status: AC
  Administered 2016-07-02: 2 g via INTRAVENOUS

## 2016-07-02 MED ORDER — NON FORMULARY: 40.0000 mg | Freq: Two times a day (BID) | Status: DC

## 2016-07-02 MED ORDER — SODIUM CHLORIDE 0.9 % IR SOLN
Status: DC | PRN
  Administered 2016-07-02: 1000 mL

## 2016-07-02 MED ORDER — FENTANYL CITRATE (PF) 100 MCG/2ML IJ SOLN
INTRAMUSCULAR | Status: AC
  Filled 2016-07-02: qty 2

## 2016-07-02 SURGICAL SUPPLY — 34 items
ADH SKN CLS APL DERMABOND .7 (GAUZE/BANDAGES/DRESSINGS) ×1
BAG SPEC THK2 15X12 ZIP CLS (MISCELLANEOUS) ×1
BAG ZIPLOCK 12X15 (MISCELLANEOUS) ×2 IMPLANT
BLADE SAG 18X100X1.27 (BLADE) ×3 IMPLANT
CAPT HIP TOTAL 2 ×2 IMPLANT
CLOTH BEACON ORANGE TIMEOUT ST (SAFETY) ×3 IMPLANT
COVER PERINEAL POST (MISCELLANEOUS) ×3 IMPLANT
DERMABOND ADVANCED (GAUZE/BANDAGES/DRESSINGS) ×2
DERMABOND ADVANCED .7 DNX12 (GAUZE/BANDAGES/DRESSINGS) ×1 IMPLANT
DRAPE STERI IOBAN 125X83 (DRAPES) ×3 IMPLANT
DRAPE U-SHAPE 47X51 STRL (DRAPES) ×6 IMPLANT
DRESSING AQUACEL AG SP 3.5X10 (GAUZE/BANDAGES/DRESSINGS) ×1 IMPLANT
DRSG AQUACEL AG ADV 3.5X10 (GAUZE/BANDAGES/DRESSINGS) ×2 IMPLANT
DRSG AQUACEL AG SP 3.5X10 (GAUZE/BANDAGES/DRESSINGS) ×3
DURAPREP 26ML APPLICATOR (WOUND CARE) ×3 IMPLANT
ELECT REM PT RETURN 9FT ADLT (ELECTROSURGICAL) ×3
ELECTRODE REM PT RTRN 9FT ADLT (ELECTROSURGICAL) ×1 IMPLANT
GLOVE BIO SURGEON STRL SZ7 (GLOVE) ×2 IMPLANT
GLOVE BIOGEL M STRL SZ7.5 (GLOVE) ×4 IMPLANT
GLOVE BIOGEL PI IND STRL 7.5 (GLOVE) ×1 IMPLANT
GLOVE BIOGEL PI INDICATOR 7.5 (GLOVE) ×12
GLOVE ORTHO TXT STRL SZ7.5 (GLOVE) ×3 IMPLANT
GOWN STRL REUS W/ TWL XL LVL3 (GOWN DISPOSABLE) IMPLANT
GOWN STRL REUS W/TWL LRG LVL3 (GOWN DISPOSABLE) ×3 IMPLANT
GOWN STRL REUS W/TWL XL LVL3 (GOWN DISPOSABLE) ×8 IMPLANT
HOLDER FOLEY CATH W/STRAP (MISCELLANEOUS) ×3 IMPLANT
PACK ANTERIOR HIP CUSTOM (KITS) ×3 IMPLANT
SUT MNCRL AB 4-0 PS2 18 (SUTURE) ×3 IMPLANT
SUT VIC AB 1 CT1 36 (SUTURE) ×9 IMPLANT
SUT VIC AB 2-0 CT1 27 (SUTURE) ×6
SUT VIC AB 2-0 CT1 TAPERPNT 27 (SUTURE) ×2 IMPLANT
SUT VLOC 180 0 24IN GS25 (SUTURE) ×3 IMPLANT
TRAY FOLEY CATH SILVER 14FR (SET/KITS/TRAYS/PACK) ×2 IMPLANT
YANKAUER SUCT BULB TIP 10FT TU (MISCELLANEOUS) ×2 IMPLANT

## 2016-07-02 NOTE — Discharge Instructions (Signed)

## 2016-07-02 NOTE — Interval H&P Note (Signed)
History and Physical Interval Note:  07/02/2016 9:57 AM  Darlene Barry  has presented today for surgery, with the diagnosis of Left hip osteoarthritis  The various methods of treatment have been discussed with the patient and family. After consideration of risks, benefits and other options for treatment, the patient has consented to  Procedure(s): LEFT TOTAL HIP ARTHROPLASTY ANTERIOR APPROACH (Left) as a surgical intervention .  The patient's history has been reviewed, patient examined, no change in status, stable for surgery.  I have reviewed the patient's chart and labs.  Questions were answered to the patient's satisfaction.     Shelda PalLIN,Oliana Gowens D

## 2016-07-02 NOTE — Transfer of Care (Signed)
Immediate Anesthesia Transfer of Care Note  Patient: Kittie PlaterMary E Subramaniam  Procedure(s) Performed: Procedure(s): LEFT TOTAL HIP ARTHROPLASTY ANTERIOR APPROACH (Left)  Patient Location: PACU  Anesthesia Type:Spinal  Level of Consciousness: awake, alert  and oriented  Airway & Oxygen Therapy: Patient Spontanous Breathing and Patient connected to nasal cannula oxygen  Post-op Assessment: Report given to RN and Post -op Vital signs reviewed and stable  Post vital signs: Reviewed and stable  Last Vitals:  Vitals:   07/02/16 0807  BP: (!) 142/62  Pulse: 95  Resp: 18    Last Pain:  Vitals:   07/02/16 0807  TempSrc: Oral      Patients Stated Pain Goal: 3 (07/02/16 0851)  Complications: No apparent anesthesia complications

## 2016-07-02 NOTE — Op Note (Signed)
NAME:  Darlene Barry                ACCOUNT NO.: [id number]      MEDICAL RECORD NO.: [id number]      FACILITY:  [facility]      PHYSICIAN:  Durene Romans D  DATE OF BIRTH:  01-Jun-1931     DATE OF PROCEDURE:  07/02/2016                                 OPERATIVE REPORT         PREOPERATIVE DIAGNOSIS: Left  hip osteoarthritis.      POSTOPERATIVE DIAGNOSIS:  Left hip osteoarthritis.      PROCEDURE:  Left total hip replacement through an anterior approach   utilizing DePuy THR system, component size 52mm pinnacle cup, a size 36+4 neutral   Altrex liner, a size 2 Hi Tri Lock stem with a 36+1.5 delta ceramic   ball.      SURGEON:  Madlyn Frankel. Charlann Boxer, M.D.      ASSISTANT:  Skip Mayer, PA-C     ANESTHESIA:  Spinal.      SPECIMENS:  None.      COMPLICATIONS:  None.      BLOOD LOSS:  125 cc     DRAINS:  None.      INDICATION OF THE PROCEDURE:  Darlene Barry is a 83 y.o. female who had   presented to office for evaluation of left hip pain.  Radiographs revealed   progressive degenerative changes with bone-on-bone   articulation to the  hip joint.  The patient had painful limited range of   motion significantly affecting their overall quality of life.  The patient was failing to    respond to conservative measures, and at this point was ready   to proceed with more definitive measures.  The patient has noted progressive   degenerative changes in his hip, progressive problems and dysfunction   with regarding the hip prior to surgery.  Consent was obtained for   benefit of pain relief.  Specific risk of infection, DVT, component   failure, dislocation, need for revision surgery, as well discussion of   the anterior versus posterior approach were reviewed.  Consent was   obtained for benefit of anterior pain relief through an anterior   approach.      PROCEDURE IN DETAIL:  The patient was brought to operative theater.   Once adequate anesthesia, preoperative  antibiotics, 2gm of Ancef, 1 gm of Tranexamic Acid, and 10 mg of Decadron administered.   The patient was positioned supine on the OSI Hanna table.  Once adequate   padding of boney process was carried out, we had predraped out the hip, and  used fluoroscopy to confirm orientation of the pelvis and position.      The left hip was then prepped and draped from proximal iliac crest to   mid thigh with shower curtain technique.      Time-out was performed identifying the patient, planned procedure, and   extremity.     An incision was then made 2 cm distal and lateral to the   anterior superior iliac spine extending over the orientation of the   tensor fascia lata muscle and sharp dissection was carried down to the   fascia of the muscle and protractor placed in the soft tissues.      The fascia was  then incised.  The muscle belly was identified and swept   laterally and retractor placed along the superior neck.  Following   cauterization of the circumflex vessels and removing some pericapsular   fat, a second cobra retractor was placed on the inferior neck.  A third   retractor was placed on the anterior acetabulum after elevating the   anterior rectus.  A L-capsulotomy was along the line of the   superior neck to the trochanteric fossa, then extended proximally and   distally.  Tag sutures were placed and the retractors were then placed   intracapsular.  We then identified the trochanteric fossa and   orientation of my neck cut, confirmed this radiographically   and then made a neck osteotomy with the femur on traction.  The femoral   head was removed without difficulty or complication.  Traction was let   off and retractors were placed posterior and anterior around the   acetabulum.      The labrum and foveal tissue were debrided.  I began reaming with a 45mm   reamer and reamed up to 51mm reamer with good bony bed preparation and a 52mm   cup was chosen.  The final 52mm Pinnacle cup  was then impacted under fluoroscopy  to confirm the depth of penetration and orientation with respect to   abduction.  A screw was placed followed by the hole eliminator.  The final   36+4 neutral Altrex liner was impacted with good visualized rim fit.  The cup was positioned anatomically within the acetabular portion of the pelvis.      At this point, the femur was rolled at 80 degrees.  Further capsule was   released off the inferior aspect of the femoral neck.  I then   released the superior capsule proximally.  The hook was placed laterally   along the femur and elevated manually and held in position with the bed   hook.  The leg was then extended and adducted with the leg rolled to 100   degrees of external rotation.  Once the proximal femur was fully   exposed, I used a box osteotome to set orientation.  I then began   broaching with the starting chili pepper broach and passed this by hand and then broached up to 2.  With the 2 broach in place I chose a high offset neck and did several trial reductions.  The offset was appropriate, leg lengths   appeared to be equal best matched with the +1.5 head ball confirmed radiographically.   Given these findings, I went ahead and dislocated the hip, repositioned all   retractors and positioned the right hip in the extended and abducted position.  The final 2 Hi Tri Lock stem was   chosen and it was impacted down to the level of neck cut.  Based on this   and the trial reduction, a 36+1.5 delta ceramic ball was chosen and   impacted onto a clean and dry trunnion, and the hip was reduced.  The   hip had been irrigated throughout the case again at this point.  I did   reapproximate the superior capsular leaflet to the anterior leaflet   using #1 Vicryl.  The fascia of the   tensor fascia lata muscle was then reapproximated using #1 Vicryl and #0 Stratafix sutures.  The   remaining wound was closed with 2-0 Vicryl and running 4-0 Monocryl.   The hip  was cleaned, dried, and  dressed sterilely using Dermabond and   Aquacel dressing.  She was then brought   to recovery room in stable condition tolerating the procedure well.    Lanney GinsMatthew Babish, PA-C was present for the entirety of the case involved from   preoperative positioning, perioperative retractor management, general   facilitation of the case, as well as primary wound closure as assistant.            Madlyn FrankelMatthew D. Charlann Boxerlin, M.D.        07/02/2016 12:35 PM

## 2016-07-02 NOTE — Progress Notes (Signed)
PT Cancellation Note  Patient Details Name: Darlene PlaterMary E Etheredge MRN: [id number] DOB: 06/22/1931   Cancelled Treatment:    Reason Eval/Treat Not Completed: Medical issues which prohibited therapy (pt painful and nauseous)   [facility],[place] Devine 07/02/2016, 4:41 PM

## 2016-07-02 NOTE — Anesthesia Preprocedure Evaluation (Addendum)
Anesthesia Evaluation  Patient identified by MRN, date of birth, ID band Patient awake    Reviewed: Allergy & Precautions, NPO status , Patient's Chart, lab work & pertinent test results  Airway Mallampati: II  TM Distance: >3 FB Neck ROM: Full    Dental   Pulmonary neg pulmonary ROS,    breath sounds clear to auscultation       Cardiovascular negative cardio ROS   Rhythm:Regular Rate:Normal     Neuro/Psych PSYCHIATRIC DISORDERS (Dementia) negative neurological ROS     GI/Hepatic Neg liver ROS, GERD  Medicated,  Endo/Other  negative endocrine ROS  Renal/GU negative Renal ROS     Musculoskeletal  (+) Arthritis ,   Abdominal   Peds  Hematology negative hematology ROS (+)   Anesthesia Other Findings   Reproductive/Obstetrics                            Lab Results  Component Value Date   WBC 4.7 06/24/2016   HGB 12.3 06/24/2016   HCT 37.7 06/24/2016   MCV 87.3 06/24/2016   PLT 266 06/24/2016   No results found for: CREATININE, BUN, NA, K, CL, CO2 No results found for: INR, PROTIME  Anesthesia Physical Anesthesia Plan  ASA: II  Anesthesia Plan: MAC and Spinal   Post-op Pain Management:    Induction: Intravenous  Airway Management Planned: Natural Airway and Simple Face Mask  Additional Equipment:   Intra-op Plan:   Post-operative Plan:   Informed Consent: I have reviewed the patients History and Physical, chart, labs and discussed the procedure including the risks, benefits and alternatives for the proposed anesthesia with the patient or authorized representative who has indicated his/her understanding and acceptance.     Plan Discussed with: CRNA  Anesthesia Plan Comments:         Anesthesia Quick Evaluation

## 2016-07-02 NOTE — Anesthesia Procedure Notes (Signed)
Spinal  Patient location during procedure: OR Start time: 07/02/2016 11:07 AM End time: 07/02/2016 11:10 AM Staffing Anesthesiologist: Arta BruceSSEY, Jyssica Rief Performed: anesthesiologist  Preanesthetic Checklist Completed: patient identified, surgical consent, pre-op evaluation, timeout performed, IV checked, risks and benefits discussed and monitors and equipment checked Spinal Block Patient position: sitting Prep: DuraPrep Patient monitoring: heart rate, cardiac monitor, continuous pulse ox and blood pressure Approach: right paramedian Location: L3-4 Injection technique: single-shot Needle Needle type: Pencan  Needle length: 9 cm Needle insertion depth: 6 cm

## 2016-07-02 NOTE — Anesthesia Postprocedure Evaluation (Signed)
Anesthesia Post Note  Patient: Darlene Barry  Procedure(s) Performed: Procedure(s) (LRB): LEFT TOTAL HIP ARTHROPLASTY ANTERIOR APPROACH (Left)  Patient location during evaluation: PACU Anesthesia Type: Spinal Level of consciousness: awake and alert Pain management: pain level controlled Vital Signs Assessment: post-procedure vital signs reviewed and stable Respiratory status: spontaneous breathing and respiratory function stable Cardiovascular status: blood pressure returned to baseline and stable Postop Assessment: spinal receding Anesthetic complications: no       Last Vitals:  Vitals:   07/02/16 1345 07/02/16 1400  BP: (!) 151/71 139/82  Pulse: 71 73  Resp: 13 13  Temp:  36.3 C    Last Pain:  Vitals:   07/02/16 1400  TempSrc:   PainSc: Asleep    LLE Motor Response: Purposeful movement (07/02/16 1400)   RLE Motor Response: Purposeful movement (07/02/16 1400)   L Sensory Level: S1-Sole of foot, small toes (07/02/16 1400) R Sensory Level: S1-Sole of foot, small toes (07/02/16 1400)  Kennieth RadFitzgerald, Keivon Garden E

## 2016-07-03 LAB — BASIC METABOLIC PANEL
Anion gap: 7 (ref 5–15)
BUN: 13 mg/dL (ref 6–20)
CHLORIDE: 104 mmol/L (ref 101–111)
CO2: 25 mmol/L (ref 22–32)
Calcium: 8.4 mg/dL — ABNORMAL LOW (ref 8.9–10.3)
Creatinine, Ser: 0.61 mg/dL (ref 0.44–1.00)
GFR calc Af Amer: >60 mL/min (ref >60)
GLUCOSE: 159 mg/dL — AB (ref 65–99)
POTASSIUM: 4.1 mmol/L (ref 3.5–5.1)
Sodium: 136 mmol/L (ref 135–145)

## 2016-07-03 LAB — CBC
HEMATOCRIT: 32.6 % — AB (ref 36.0–46.0)
HEMOGLOBIN: 11 g/dL — AB (ref 12.0–15.0)
MCH: 29.2 pg (ref 26.0–34.0)
MCHC: 33.7 g/dL (ref 30.0–36.0)
MCV: 86.5 fL (ref 78.0–100.0)
PLATELETS: 237 10*3/uL (ref 150–400)
RBC: 3.77 MIL/uL — AB (ref 3.87–5.11)
RDW: 12.7 % (ref 11.5–15.5)
WBC: 11.6 10*3/uL — AB (ref 4.0–10.5)

## 2016-07-03 NOTE — Evaluation (Signed)
Physical Therapy Evaluation Patient Details Name: Darlene Barry MRN: [id number] DOB: October 23, 1931 Today's Date: 07/03/2016   History of Present Illness  (P) 83 yo female s/p L THA-direct anterior 07/02/16.   Clinical Impression  On eval, pt was Min guard-Min assist for mobility. She walked ~115 feet with a RW. Pain rated 5/10 during session. Anticipate pt will progress well during stay. Will follow.     Follow Up Recommendations No PT follow up;Supervision/Assistance - 24 hour    Equipment Recommendations  None recommended by PT (pt borrowed DME)    Recommendations for Other Services OT consult     Precautions / Restrictions Precautions Precautions:  Fall Restrictions Weight Bearing Restrictions: No LLE Weight Bearing:  Weight bearing as tolerated      Mobility  Bed Mobility Overal bed mobility: Needs Assistance Bed Mobility: Supine to Sit     Supine to sit: Min assist;HOB elevated     General bed mobility comments: small amount of assist for L LE.   Transfers Overall transfer level: Needs assistance Equipment used: Rolling walker (2 wheeled) Transfers: Sit to/from Stand Sit to Stand: Min guard         General transfer comment: close guard for safety. VCs safety, hand placement  Ambulation/Gait Ambulation/Gait assistance: Min guard Ambulation Distance (Feet): 115 Feet Assistive device: Rolling walker (2 wheeled) Gait Pattern/deviations: Step-to pattern;Step-through pattern;Decreased stride length     General Gait Details: fair gait speed. Cues for safety, sequence, proper use of RW. Pt tolerated distance well.   Stairs            Wheelchair Mobility    Modified Rankin (Stroke Patients Only)       Balance                                             Pertinent Vitals/Pain Pain Assessment: (P) Faces Pain Score: 5  Faces Pain Scale: (P) Hurts little more Pain Location: (P) L hip with activity Pain Descriptors / Indicators: (P)  Sore Pain Intervention(s): (P) Limited activity within patient's tolerance;Monitored during session;Premedicated before session;Repositioned;Ice applied    Home Living Family/patient expects to be discharged to:: (P) Private residence Living Arrangements: (P) Spouse/significant other Available Help at Discharge: (P) Family Type of Home: (P) House Home Access: Stairs to enter Entrance Stairs-Rails: None Entrance Stairs-Number of Steps: 2 Home Layout: Two level;Able to live on main level with bedroom/bathroom Home Equipment: (P) Shower seat - built in Additional Comments: (P) pt has borrowed RW, [facility]    Prior Function Level of Independence: (P) Independent               Hand Dominance        Extremity/Trunk Assessment   Upper Extremity Assessment Upper Extremity Assessment: (P) Overall WFL for tasks assessed    Lower Extremity Assessment Lower Extremity Assessment: Generalized weakness (s/p L THA )    Cervical / Trunk Assessment Cervical / Trunk Assessment: Normal  Communication   Communication: (P) No difficulties  Cognition Arousal/Alertness: Awake/alert Behavior During Therapy: WFL for tasks assessed/performed Overall Cognitive Status: Within Functional Limits for tasks assessed                      General Comments      Exercises Total Joint Exercises Ankle Circles/Pumps: AROM;Both;10 reps;Supine Quad Sets: AROM;Both;10 reps;Supine Heel Slides: AAROM;Left;10 reps;Supine Hip ABduction/ADduction: AAROM;Left;10 reps;Supine  Assessment/Plan    PT Assessment Patient needs continued PT services  PT Problem List Decreased strength;Decreased mobility;Decreased range of motion;Decreased activity tolerance;Decreased balance;Decreased knowledge of use of DME;Pain          PT Treatment Interventions DME instruction;Therapeutic activities;Gait training;Therapeutic exercise;Patient/family education;Balance training;Functional mobility training    PT  Goals (Current goals can be found in the Care Plan section)  Acute Rehab PT Goals Patient Stated Goal: home soon PT Goal Formulation: With patient Time For Goal Achievement: 07/17/16 Potential to Achieve Goals: Good    Frequency 7X/week   Barriers to discharge        Co-evaluation               End of Session Equipment Utilized During Treatment: Gait belt Activity Tolerance: Patient tolerated treatment well Patient left: in chair;with call bell/phone within reach;with family/visitor present;with chair alarm set           Time: [id number] PT Time Calculation (min) (ACUTE ONLY): 22 min   Charges:   PT Evaluation $PT Eval Low Complexity: 1 Procedure     PT G Codes:        Rebeca AlertJannie Katelyn Broadnax, MPT Pager: [phone]

## 2016-07-03 NOTE — Progress Notes (Signed)
Physical Therapy Treatment Patient Details Name: Darlene Barry MRN: [id number] DOB: 1932/02/02 Today's Date: 07/03/2016    History of Present Illness 83 yo female s/p L THA-direct anterior 07/02/16.     PT Comments    Progressing with mobility. Cues for safety during session.   Follow Up Recommendations  No PT follow up;Supervision/Assistance - 24 hour     Equipment Recommendations  None recommended by PT    Recommendations for Other Services OT consult     Precautions / Restrictions Precautions Precautions: Fall Restrictions Weight Bearing Restrictions: No LLE Weight Bearing: Weight bearing as tolerated    Mobility  Bed Mobility Overal bed mobility: Needs Assistance Bed Mobility: Supine to Sit;Sit to Supine     Supine to sit: Min assist Sit to supine: Min guard   General bed mobility comments: small amount of assist for L LE to get to EOB. No assist to get LEs back onto bed.   Transfers Overall transfer level: Needs assistance Equipment used: Rolling walker (2 wheeled) Transfers: Sit to/from Stand Sit to Stand: Min guard         General transfer comment: close guard for safety. VCs safety, hand placement  Ambulation/Gait Ambulation/Gait assistance: Min guard Ambulation Distance (Feet): 150 Feet Assistive device: Rolling walker (2 wheeled) Gait Pattern/deviations: Step-through pattern;Decreased stride length     General Gait Details: fair gait speed. Cues for safety, sequence, proper use of RW. Pt tolerated distance well.    Stairs            Wheelchair Mobility    Modified Rankin (Stroke Patients Only)       Balance                                    Cognition Arousal/Alertness: Awake/alert Behavior During Therapy: WFL for tasks assessed/performed Overall Cognitive Status: History of cognitive impairments - at baseline                 General Comments: h/o dementia; RN reports she waxes and wanes.  Multimodal cues  for safety    Exercises   General Comments        Pertinent Vitals/Pain Pain Assessment: 0-10 Pain Score: 5  Faces Pain Scale: Hurts little more Pain Location: L hip/thigh with activity Pain Descriptors / Indicators: Sore Pain Intervention(s): Monitored during session;Repositioned;Ice applied    Home Living Family/patient expects to be discharged to:: Private residence Living Arrangements: Spouse/significant other Available Help at Discharge: Family Type of Home: House Home Access: Stairs to enter Entrance Stairs-Rails: None Home Layout: Two level;Able to live on main level with bedroom/bathroom Home Equipment: Shower seat - built in Additional Comments: [facility]    Prior Function Level of Independence: Independent          PT Goals (current goals can now be found in the care plan section) Acute Rehab PT Goals Patient Stated Goal: home soon PT Goal Formulation: With patient Time For Goal Achievement: 07/17/16 Potential to Achieve Goals: Good Progress towards PT goals: Progressing toward goals    Frequency    7X/week      PT Plan Current plan remains appropriate    Co-evaluation             End of Session Equipment Utilized During Treatment: Gait belt Activity Tolerance: Patient tolerated treatment well Patient left: in bed;with call bell/phone within reach;with family/visitor present     Time:  4098-1191 PT Time Calculation (min) (ACUTE ONLY): 16 min  Charges:  $Gait Training: 8-22 mins                    G Codes:      Rebeca Alert, MPT Pager: [phone]

## 2016-07-03 NOTE — Evaluation (Signed)
Occupational Therapy Evaluation Patient Details Name: Darlene PlaterMary E Frerichs MRN: [id number] DOB: 12/24/1931 Today's Date: 07/03/2016    History of Present Illness 83 yo female s/p L THA-direct anterior 07/02/16.    Clinical Impression   Pt was admitted for the above sx. Will follow in acute setting to reinforce and further educate on bathroom transfers    Follow Up Recommendations  Supervision/Assistance - 24 hour    Equipment Recommendations  None recommended by OT    Recommendations for Other Services       Precautions / Restrictions Precautions Precautions: Fall Restrictions Weight Bearing Restrictions: No LLE Weight Bearing: Weight bearing as tolerated      Mobility Bed Mobility Overal bed mobility: Needs Assistance Bed Mobility: Supine to Sit     Supine to sit: Min assist;HOB elevated     General bed mobility comments: small amount of assist for L LE.  performed by PT  Transfers Overall transfer level: Needs assistance Equipment used: Rolling walker (2 wheeled) Transfers: Sit to/from Stand Sit to Stand: Min guard         General transfer comment: close guard for safety. VCs safety, hand placement    Balance                                            ADL Overall ADL's : Needs assistance/impaired     Grooming: Wash/dry hands;Min guard;Standing       Lower Body Bathing: Moderate assistance;Sit to/from stand       Lower Body Dressing: Maximal assistance;Bed level   Toilet Transfer: Minimal assistance;Ambulation;BSC;RW             General ADL Comments: pt will have assistance with adls as needed.  ADLs limited by discomfort. She can perform UB adls with set up.  Pt has catheter in place, but ambulated to bathroom to practice toilet transfer.  Pt needed multimodal cues including holding RW back for sequence and safety.  Pt states 2 daughters will be coming to help for a few days     Vision     Perception     Praxis       Pertinent Vitals/Pain Faces 4:  Hurts a little more Premedicated, repositioned, monitored, limited within pain tolerance     Hand Dominance     Extremity/Trunk Assessment Upper Extremity Assessment Upper Extremity Assessment: Overall WFL for tasks assessed      Cervical / Trunk Assessment Cervical / Trunk Assessment: Normal   Communication Communication Communication: No difficulties   Cognition Arousal/Alertness: Awake/alert Behavior During Therapy: WFL for tasks assessed/performed Overall Cognitive Status: History of cognitive impairments - at baseline                 General Comments: h/o dementia; RN reports she waxes and wanes.  Multimodal cues for safety   General Comments       Exercises       Shoulder Instructions      Home Living Family/patient expects to be discharged to:: Private residence Living Arrangements: Spouse/significant other Available Help at Discharge: Family Type of Home: House Home Access: Stairs to enter Enter[employer] CorporationEntrance Stairs-Number of Steps: 2 Entrance Stairs-Rails: None Home Layout: Two level;Able to live on main level with bedroom/bathroom     Bathroom Shower/Tub: Producer, television/film/videoWalk-in shower   Bathroom Toilet: Standard     Home Equipment: Shower seat - built in   Additional  Comments: pt has borrowed RW, [facility]      Prior Functioning/Environment Level of Independence: Independent                 OT Problem List: Pain;Decreased knowledge of use of DME or AE;Decreased cognition;Decreased safety awareness   OT Treatment/Interventions: Self-care/ADL training;DME and/or AE instruction;Patient/family education    OT Goals(Current goals can be found in the care plan section) Acute Rehab OT Goals Patient Stated Goal: home soon OT Goal Formulation: With patient Time For Goal Achievement: 07/06/16 Potential to Achieve Goals: Good ADL Goals Pt Will Transfer to Toilet: with min guard assist;ambulating;bedside commode Pt Will Perform  Tub/Shower Transfer: Shower transfer;with min guard assist;ambulating;shower seat  OT Frequency: Min 2X/week   Barriers to D/C:            Co-evaluation              End of Session    Activity Tolerance: Patient tolerated treatment well Patient left: in bed;with call bell/phone within reach;with chair alarm set;with family/visitor present   Time: 1610-9604 OT Time Calculation (min): 16 min Charges:  OT General Charges $OT Visit: 1 Procedure OT Evaluation $OT Eval Low Complexity: 1 Procedure G-Codes:    Damica Gravlin 2016/07/19, 12:06 PM Marica Otter, OTR/L [phone] 07/19/16

## 2016-07-03 NOTE — Progress Notes (Signed)
     Subjective: 1 Day Post-Op Procedure(s) (LRB): LEFT TOTAL HIP ARTHROPLASTY ANTERIOR APPROACH (Left)   Patient reports pain as mild, pain controlled. No reported events throughout the night. This morning she started to have some significant nausea, hopefully related to anesthesia meds.  She also states that she has not had much urine output.  We discussed watching this throughout the day.   Objective:   VITALS:   Vitals:   07/03/16 0212 07/03/16 0622  BP: (!) 144/68 (!) 136/59  Pulse: 89 95  Resp: 16 16  Temp: 98.6 F (37 C) 98.7 F (37.1 C)    Dorsiflexion/Plantar flexion intact Incision: dressing C/D/I No cellulitis present Compartment soft  LABS  Recent Labs  07/03/16 0429  HGB 11.0*  HCT 32.6*  WBC 11.6*  PLT 237     Recent Labs  07/03/16 0429  NA 136  K 4.1  BUN 13  CREATININE 0.61  GLUCOSE 159*     Assessment/Plan: 1 Day Post-Op Procedure(s) (LRB): LEFT TOTAL HIP ARTHROPLASTY ANTERIOR APPROACH (Left) Foley cath d/c'ed Advance diet Up with therapy D/C IV fluids Discharge home, eventually when ready       Anastasio AuerbachMatthew S. Demeisha Geraghty   PAC  07/03/2016, 8:46 AM

## 2016-07-04 LAB — CBC
HCT: 31.9 % — ABNORMAL LOW (ref 36.0–46.0)
Hemoglobin: 10.6 g/dL — ABNORMAL LOW (ref 12.0–15.0)
MCH: 29 pg (ref 26.0–34.0)
MCHC: 33.2 g/dL (ref 30.0–36.0)
MCV: 87.4 fL (ref 78.0–100.0)
PLATELETS: 228 10*3/uL (ref 150–400)
RBC: 3.65 MIL/uL — ABNORMAL LOW (ref 3.87–5.11)
RDW: 13.2 % (ref 11.5–15.5)
WBC: 10.5 10*3/uL (ref 4.0–10.5)

## 2016-07-04 LAB — BASIC METABOLIC PANEL
Anion gap: 4 — ABNORMAL LOW (ref 5–15)
BUN: 13 mg/dL (ref 6–20)
CALCIUM: 9 mg/dL (ref 8.9–10.3)
CO2: 26 mmol/L (ref 22–32)
CREATININE: 0.73 mg/dL (ref 0.44–1.00)
Chloride: 109 mmol/L (ref 101–111)
GFR calc Af Amer: >60 mL/min (ref >60)
GLUCOSE: 111 mg/dL — AB (ref 65–99)
POTASSIUM: 3.8 mmol/L (ref 3.5–5.1)
SODIUM: 139 mmol/L (ref 135–145)

## 2016-07-04 MED ORDER — HYDROCODONE-ACETAMINOPHEN 7.5-325 MG PO TABS: 1.0000 | ORAL_TABLET | ORAL | 0 refills | Status: DC | PRN

## 2016-07-04 MED ORDER — DOCUSATE SODIUM 100 MG PO CAPS: 100.0000 mg | ORAL_CAPSULE | Freq: Two times a day (BID) | ORAL | 0 refills | Status: DC

## 2016-07-04 MED ORDER — ASPIRIN 81 MG PO CHEW: 81.0000 mg | CHEWABLE_TABLET | Freq: Two times a day (BID) | ORAL | 0 refills | Status: AC

## 2016-07-04 MED ORDER — FERROUS SULFATE 325 (65 FE) MG PO TABS: 325.0000 mg | ORAL_TABLET | Freq: Three times a day (TID) | ORAL | 3 refills | Status: DC

## 2016-07-04 MED ORDER — POLYETHYLENE GLYCOL 3350 17 G PO PACK: 17.0000 g | PACK | Freq: Two times a day (BID) | ORAL | 0 refills | Status: DC

## 2016-07-04 MED ORDER — METHOCARBAMOL 500 MG PO TABS: 500.0000 mg | ORAL_TABLET | Freq: Four times a day (QID) | ORAL | 0 refills | Status: DC | PRN

## 2016-07-04 NOTE — Progress Notes (Signed)
     Subjective: 2 Days Post-Op Procedure(s) (LRB): LEFT TOTAL HIP ARTHROPLASTY ANTERIOR APPROACH (Left)   Patient reports pain as mild, pain controlled. No events throughout the night.  Feeling very good this morning.  Ready to be discharged home.   Objective:   VITALS:   Vitals:   07/03/16 2134 07/04/16 0531  BP: 129/68 122/70  Pulse: 92 (!) 111  Resp: 16 16  Temp: 98.9 F (37.2 C) 99.3 F (37.4 C)    Dorsiflexion/Plantar flexion intact Incision: dressing C/D/I No cellulitis present Compartment soft  LABS  Recent Labs  07/03/16 0429 07/04/16 0424  HGB 11.0* 10.6*  HCT 32.6* 31.9*  WBC 11.6* 10.5  PLT 237 228     Recent Labs  07/03/16 0429 07/04/16 0424  NA 136 139  K 4.1 3.8  BUN 13 13  CREATININE 0.61 0.73  GLUCOSE 159* 111*     Assessment/Plan: 2 Days Post-Op Procedure(s) (LRB): LEFT TOTAL HIP ARTHROPLASTY ANTERIOR APPROACH (Left) Up with therapy Discharge home [facility] Follow up with OLIN,Jaliyah Fotheringham D in 2 weeks.  Contact information:  [facility] [address] [place] [id number] [phone]        Anastasio AuerbachMatthew S. Nirvaan Frett   PAC  07/04/2016, 8:20 AM

## 2016-07-04 NOTE — Progress Notes (Signed)
Occupational Therapy Treatment Patient Details Name: Darlene Barry MRN: [id number] DOB: [birth year] Today's Date: 07/04/2016    History of present illness 83 yo female s/p L THA-direct anterior 07/02/16.    OT comments  Family not present.  Pt followed all cues and completed bathroom transfers and adls.  Gave her a handout on sequence for shower transfer. Will try to stop by when family is here to review and see if they have any questions.  Follow Up Recommendations  Supervision/Assistance - 24 hour    Equipment Recommendations  None recommended by OT    Recommendations for Other Services      Precautions / Restrictions Precautions Precautions: Fall Restrictions Weight Bearing Restrictions: No LLE Weight Bearing: Weight bearing as tolerated       Mobility Bed Mobility              Transfers Overall transfer level: Needs assistance Equipment used: Rolling walker (2 wheeled) Transfers: Sit to/from Stand Sit to Stand: Min guard         General transfer comment: close guard for safety. VCs safety, hand placement    Balance                                   ADL       Grooming: Oral care;Min guard;Standing               Lower Body Dressing: Moderate assistance;Sit to/from stand   Toilet Transfer: Min guard;Ambulation;BSC;RW   Toileting- Architect and Hygiene: Min guard;Sit to/from stand   Tub/ Shower Transfer: Walk-in shower;Min guard;3 in 1;Ambulation     General ADL Comments: assisted with getting clothes on and brushing her teeth after using toilet and practicing shower transfer.  No family present:  left handout on shower transfer.      Vision                     Perception     Praxis      Cognition   Behavior During Therapy: WFL for tasks assessed/performed Overall Cognitive Status: History of cognitive impairments - at baseline                  General Comments: h/o dementia; RN reports she  waxes and wanes. Cues for safety    Extremity/Trunk Assessment               Exercises    Shoulder Instructions       General Comments      Pertinent Vitals/ Pain       Pain Assessment: Faces Faces Pain Scale: Hurts a little bit Pain Location: L hip/thigh with activity Pain Descriptors / Indicators: Sore Pain Intervention(s): Monitored during session;Repositioned  Home Living                                          Prior Functioning/Environment              Frequency           Progress Toward Goals  OT Goals(current goals can now be found in the care plan section)        Plan      Co-evaluation                 End of Session  Activity Tolerance Patient tolerated treatment well   Patient Left in chair;with call bell/phone within reach;with chair alarm set   Nurse Communication          Time: [id number] OT Time Calculation (min): 23 min  Charges: OT General Charges $OT Visit: 1 Procedure OT Treatments $Self Care/Home Management : 8-22 mins  Stephone Gum 07/04/2016, 10:00 AM  Marica OtterMaryellen Ashly Goethe, OTR/L [phone] 07/04/2016

## 2016-07-04 NOTE — Care Management Note (Signed)
Case Management Note  Patient Details  Name: Darlene Barry MRN: [id number] Date of Birth: 09-10-1931  Subjective/Objective:                  LEFT TOTAL HIP ARTHROPLASTY ANTERIOR APPROACH (Left) Action/Plan: Discharge planning Expected Discharge Date:  07/04/16               Expected Discharge Plan:  Home/Self Care  In-House Referral:     Discharge planning Services  CM Consult  Post Acute Care Choice:  NA Choice offered to:  Patient  DME Arranged:  N/A DME Agency:  NA  HH Arranged:  NA HH Agency:  NA  Status of Service:  Completed, signed off  If discussed at [place] of Stay Meetings, dates discussed:    Additional Comments: CM met with pt in room to confirm plan is for NO home health services; pt and PA confirm.  Pt also states she has rolling walker, 3n1 and shower stool at home. No other CM needs were communicated. [name], RN 07/04/2016, 10:27 AM

## 2016-07-04 NOTE — Progress Notes (Signed)
Physical Therapy Treatment Patient Details Name: Darlene PlaterMary E Mcelvain MRN: [id number] DOB: 1932/02/24 Today's Date: 07/04/2016    History of Present Illness 83 yo female s/p L THA-direct anterior 07/02/16.     PT Comments    Progressing with mobility. Reviewed/practiced bed mobility, ambulation, exercises, and stair training. All education completed. Issued HEP for pt to perform 2x/day. Pt is set to d/c later today. Per MD instructions, pt will not have any follow up PT at discharge.    Follow Up Recommendations  No PT follow up;Supervision/Assistance - 24 hour     Equipment Recommendations  None recommended by PT    Recommendations for Other Services       Precautions / Restrictions Precautions Precautions: Fall Restrictions Weight Bearing Restrictions: No LLE Weight Bearing: Weight bearing as tolerated    Mobility  Bed Mobility Overal bed mobility: Needs Assistance Bed Mobility: Sit to Supine       Sit to supine: Min guard   General bed mobility comments: increased time.   Transfers Overall transfer level: Needs assistance Equipment used: Rolling walker (2 wheeled) Transfers: Sit to/from Stand Sit to Stand: Min guard         General transfer comment: close guard for safety. VCs safety, hand placement  Ambulation/Gait Ambulation/Gait assistance: Min guard Ambulation Distance (Feet): 150 Feet Assistive device: Rolling walker (2 wheeled) Gait Pattern/deviations: Step-through pattern;Decreased stride length     General Gait Details: fair gait speed. Cues for safety. Pt tolerated distance well.    Stairs Stairs: Yes   Stair Management: Step to pattern;Forwards;No rails Number of Stairs: 2 General stair comments: 1 HHA given. VCs safety, sequence, technique.   Wheelchair Mobility    Modified Rankin (Stroke Patients Only)       Balance                                    Cognition Arousal/Alertness: Awake/alert Behavior During Therapy:  WFL for tasks assessed/performed Overall Cognitive Status: History of cognitive impairments - at baseline                 General Comments: h/o dementia; RN reports she waxes and wanes. Cues for safety    Exercises Total Joint Exercises Ankle Circles/Pumps: AROM;Both;10 reps;Supine Quad Sets: AROM;Both;10 reps;Supine Heel Slides: Left;10 reps;Supine;AROM Hip ABduction/ADduction: AAROM;Left;10 reps;Supine Long Arc Quad: AROM;Left;10 reps;Seated Knee Flexion: AROM;Left;10 reps;Seated Marching in Standing: AROM;Both;Standing General Exercises - Lower Extremity Heel Raises: AROM;10 reps;Standing    General Comments        Pertinent Vitals/Pain Pain Assessment: Faces Faces Pain Scale: Hurts a little bit Pain Location: L hip/thigh with activity Pain Descriptors / Indicators: Sore Pain Intervention(s): Monitored during session;Repositioned    Home Living                      Prior Function            PT Goals (current goals can now be found in the care plan section) Progress towards PT goals: Progressing toward goals    Frequency    7X/week      PT Plan Current plan remains appropriate    Co-evaluation             End of Session Equipment Utilized During Treatment: Gait belt Activity Tolerance: Patient tolerated treatment well Patient left: with bed alarm set     Time: [id number] PT Time Calculation (min) (ACUTE ONLY): 21  min  Charges:  $Gait Training: 8-22 mins                    G Codes:      Rebeca Alert, MPT Pager: [phone]

## 2016-07-08 NOTE — Discharge Summary (Signed)
Physician Discharge Summary  Patient ID: WAUNETTA RIGGLE MRN: [id number] DOB/AGE: 09/02/1931 83 y.o.  Admit date: 07/02/2016 Discharge date: 07/04/2016   Procedures:  Procedure(s) (LRB): LEFT TOTAL HIP ARTHROPLASTY ANTERIOR APPROACH (Left)  Attending Physician:  Dr. Durene Romans   Admission Diagnoses:   Left hip primary OA / pain  Discharge Diagnoses:  Principal Problem:   S/P left THA, AA Active Problems:   S/P hip replacement  Past Medical History:  Diagnosis Date  . Arthritis   . Dementia   . Detached retina   . Macular degeneration     HPI:    Darlene Barry, 83 y.o. female, has a history of pain and functional disability in the left hip(s) due to arthritis and patient has failed non-surgical conservative treatments for greater than 12 weeks to include NSAID's and/or analgesics and activity modification.  Onset of symptoms was gradual starting  years ago with gradually worsening course since that time.The patient noted no past surgery on the left hip(s).  Patient currently rates pain in the left hip at 9 out of 10 with activity. Patient has worsening of pain with activity and weight bearing, trendelenberg gait, pain that interfers with activities of daily living and pain with passive range of motion. Patient has evidence of periarticular osteophytes and joint space narrowing by imaging studies. This condition presents safety issues increasing the risk of falls. There is no current active infection.   Risks, benefits and expectations were discussed with the patient.  Risks including but not limited to the risk of anesthesia, blood clots, nerve damage, blood vessel damage, failure of the prosthesis, infection and up to and including death.  Patient understand the risks, benefits and expectations and wishes to proceed with surgery.  PCP: Jerl Mina, MD   Discharged Condition: good  Hospital Course:  Patient underwent the above stated procedure on 07/02/2016. Patient tolerated the  procedure well and brought to the recovery room in good condition and subsequently to the floor.  POD #1 BP: 135/59 ; Pulse: 95 ; Temp: 98.7 F (37.1 C) ; Resp: 16 Patient reports pain as mild, pain controlled. No reported events throughout the night. This morning she started to have some significant nausea, hopefully related to anesthesia meds.  She also states that she has not had much urine output.  We discussed watching this throughout the day. Dorsiflexion/plantar flexion intact, incision: dressing C/D/I, no cellulitis present and compartment soft.   LABS  Basename    HGB     11.0  HCT     32.6   POD #2  BP: 122/70 ; Pulse: 111 ; Temp: 99.3 F (37.4 C) ; Resp: 16 Patient reports pain as mild, pain controlled. No events throughout the night.  Feeling very good this morning.  Ready to be discharged home. Dorsiflexion/plantar flexion intact, incision: dressing C/D/I, no cellulitis present and compartment soft.   LABS  Basename    HGB     10.6  HCT     31.9    Discharge Exam: General appearance: alert, cooperative and no distress Extremities: Homans sign is negative, no sign of DVT, no edema, redness or tenderness in the calves or thighs and no ulcers, gangrene or trophic changes  Disposition: Home with follow up in 2 weeks   Follow-up Information    Shelda Pal, MD. Schedule an appointment as soon as possible for a visit in 2 week(s).   Specialty:  Orthopedic Surgery Contact information: [address] [place] [address]  [phone]           Discharge Instructions    Call MD / Call 911    Complete by:  As directed    If you experience chest pain or shortness of breath, CALL 911 and be transported to the hospital emergency room.  If you develope a fever above 101 F, pus (white drainage) or increased drainage or redness at the wound, or calf pain, call your surgeon's office.   Change dressing    Complete by:  As directed    Maintain surgical  dressing until follow up in the clinic. If the edges start to pull up, may reinforce with tape. If the dressing is no longer working, may remove and cover with gauze and tape, but must keep the area dry and clean.  Call with any questions or concerns.   Constipation Prevention    Complete by:  As directed    Drink plenty of fluids.  Prune juice may be helpful.  You may use a stool softener, such as Colace (over the counter) 100 mg twice a day.  Use MiraLax (over the counter) for constipation as needed.   Diet - low sodium heart healthy    Complete by:  As directed    Discharge instructions    Complete by:  As directed    Maintain surgical dressing until follow up in the clinic. If the edges start to pull up, may reinforce with tape. If the dressing is no longer working, may remove and cover with gauze and tape, but must keep the area dry and clean.  Follow up in 2 weeks at [facility]. Call with any questions or concerns.   Increase activity slowly as tolerated    Complete by:  As directed    Weight bearing as tolerated with assist device (walker, cane, etc) as directed, use it as long as suggested by your surgeon or therapist, typically at least 4-6 weeks.   TED hose    Complete by:  As directed    Use stockings (TED hose) for 2 weeks on both leg(s).  You may remove them at night for sleeping.      Allergies as of 07/04/2016      Reactions   Sulfa Antibiotics Other (See Comments)      Medication List    STOP taking these medications   acetaminophen 500 MG tablet Commonly known as:  TYLENOL   aspirin EC 81 MG tablet Replaced by:  aspirin 81 MG chewable tablet     TAKE these medications   aspirin 81 MG chewable tablet Chew 1 tablet (81 mg total) by mouth 2 (two) times daily. Replaces:  aspirin EC 81 MG tablet   CITRACAL +D3 PO Take 1 tablet by mouth daily.   denosumab 60 MG/ML Soln injection Commonly known as:  PROLIA Inject 60 mg into the skin every 6 (six)  months. Administer in upper arm, thigh, or abdomen Next dose is due February 2018   docusate sodium 100 MG capsule Commonly known as:  COLACE Take 1 capsule (100 mg total) by mouth 2 (two) times daily. What changed:  when to take this   esomeprazole 40 MG capsule Commonly known as:  NEXIUM Take 40 mg by mouth 2 (two) times daily before a meal.   EX-LAX PO Take 1 tablet by mouth daily.   ferrous sulfate 325 (65 FE) MG tablet Take 1 tablet (325 mg total) by mouth 3 (three) times daily after meals.   gabapentin 100  MG capsule Commonly known as:  NEURONTIN Take 200 mg by mouth 3 (three) times daily.   HYDROcodone-acetaminophen 7.5-325 MG tablet Commonly known as:  NORCO Take 1-2 tablets by mouth every 4 (four) hours as needed for moderate pain.   methocarbamol 500 MG tablet Commonly known as:  ROBAXIN Take 1 tablet (500 mg total) by mouth every 6 (six) hours as needed for muscle spasms.   polyethylene glycol packet Commonly known as:  MIRALAX / GLYCOLAX Take 17 g by mouth 2 (two) times daily.   PRESERVISION AREDS PO Take 1 tablet by mouth daily.   sennosides-docusate sodium 8.6-50 MG tablet Commonly known as:  SENOKOT-S Take 1 tablet by mouth daily.   Vitamin D 2000 units Caps Take 2,000 Units by mouth daily.        Signed: Anastasio AuerbachMatthew S. Shanzay Hepworth   PA-C  07/08/2016, 10:02 AM

## 2016-12-12 ENCOUNTER — Other Ambulatory Visit: Payer: Self-pay | Admitting: Family Medicine

## 2016-12-12 DIAGNOSIS — Z1231 Encounter for screening mammogram for malignant neoplasm of breast: Secondary | ICD-10-CM

## 2017-01-09 ENCOUNTER — Ambulatory Visit
Admission: RE | Admit: 2017-01-09 | Discharge: 2017-01-09 | Disposition: A | Discharge status: Home / Self Care | Payer: Medicare Other | Source: Ambulatory Visit | Attending: Family Medicine | Admitting: Family Medicine

## 2017-01-09 DIAGNOSIS — Z1231 Encounter for screening mammogram for malignant neoplasm of breast: Secondary | ICD-10-CM | POA: Diagnosis present

## 2017-04-07 ENCOUNTER — Ambulatory Visit (INDEPENDENT_AMBULATORY_CARE_PROVIDER_SITE_OTHER): Payer: Medicare Other | Admitting: Ophthalmology

## 2017-04-07 DIAGNOSIS — H338 Other retinal detachments: Secondary | ICD-10-CM | POA: Diagnosis not present

## 2017-04-07 DIAGNOSIS — H353122 Nonexudative age-related macular degeneration, left eye, intermediate dry stage: Secondary | ICD-10-CM | POA: Diagnosis not present

## 2017-04-07 DIAGNOSIS — H33301 Unspecified retinal break, right eye: Secondary | ICD-10-CM

## 2017-04-07 DIAGNOSIS — H43813 Vitreous degeneration, bilateral: Secondary | ICD-10-CM

## 2017-04-07 DIAGNOSIS — H35371 Puckering of macula, right eye: Secondary | ICD-10-CM | POA: Diagnosis not present

## 2017-12-01 ENCOUNTER — Other Ambulatory Visit: Payer: Self-pay | Admitting: Family Medicine

## 2018-01-02 ENCOUNTER — Other Ambulatory Visit: Payer: Self-pay | Admitting: Family Medicine

## 2018-01-02 DIAGNOSIS — Z1231 Encounter for screening mammogram for malignant neoplasm of breast: Secondary | ICD-10-CM

## 2018-01-07 DIAGNOSIS — I208 Other forms of angina pectoris: Secondary | ICD-10-CM | POA: Diagnosis present

## 2018-01-14 ENCOUNTER — Encounter
Admission: RE | Disposition: A | Discharge status: Home / Self Care | Payer: Self-pay | Source: Ambulatory Visit | Attending: Internal Medicine

## 2018-01-14 ENCOUNTER — Ambulatory Visit
Admission: RE | Admit: 2018-01-14 | Discharge: 2018-01-14 | Disposition: A | Discharge status: Home / Self Care | Payer: Medicare Other | Source: Ambulatory Visit | Attending: Internal Medicine | Admitting: Internal Medicine

## 2018-01-14 ENCOUNTER — Encounter: Payer: Self-pay | Admitting: *Deleted

## 2018-01-14 DIAGNOSIS — R079 Chest pain, unspecified: Secondary | ICD-10-CM | POA: Diagnosis present

## 2018-01-14 DIAGNOSIS — Z7982 Long term (current) use of aspirin: Secondary | ICD-10-CM | POA: Diagnosis not present

## 2018-01-14 DIAGNOSIS — Z880 Allergy status to penicillin: Secondary | ICD-10-CM | POA: Insufficient documentation

## 2018-01-14 DIAGNOSIS — Z9889 Other specified postprocedural states: Secondary | ICD-10-CM | POA: Diagnosis not present

## 2018-01-14 DIAGNOSIS — K219 Gastro-esophageal reflux disease without esophagitis: Secondary | ICD-10-CM | POA: Insufficient documentation

## 2018-01-14 DIAGNOSIS — Z882 Allergy status to sulfonamides status: Secondary | ICD-10-CM | POA: Insufficient documentation

## 2018-01-14 DIAGNOSIS — Z9842 Cataract extraction status, left eye: Secondary | ICD-10-CM | POA: Insufficient documentation

## 2018-01-14 DIAGNOSIS — Z86010 Personal history of colonic polyps: Secondary | ICD-10-CM | POA: Diagnosis not present

## 2018-01-14 DIAGNOSIS — Z9841 Cataract extraction status, right eye: Secondary | ICD-10-CM | POA: Insufficient documentation

## 2018-01-14 DIAGNOSIS — E782 Mixed hyperlipidemia: Secondary | ICD-10-CM | POA: Insufficient documentation

## 2018-01-14 DIAGNOSIS — R9439 Abnormal result of other cardiovascular function study: Secondary | ICD-10-CM | POA: Insufficient documentation

## 2018-01-14 DIAGNOSIS — I208 Other forms of angina pectoris: Secondary | ICD-10-CM | POA: Diagnosis present

## 2018-01-14 DIAGNOSIS — H269 Unspecified cataract: Secondary | ICD-10-CM | POA: Diagnosis not present

## 2018-01-14 DIAGNOSIS — M1990 Unspecified osteoarthritis, unspecified site: Secondary | ICD-10-CM | POA: Insufficient documentation

## 2018-01-14 DIAGNOSIS — Z79899 Other long term (current) drug therapy: Secondary | ICD-10-CM | POA: Diagnosis not present

## 2018-01-14 DIAGNOSIS — Z96642 Presence of left artificial hip joint: Secondary | ICD-10-CM | POA: Diagnosis not present

## 2018-01-14 DIAGNOSIS — Z8249 Family history of ischemic heart disease and other diseases of the circulatory system: Secondary | ICD-10-CM | POA: Diagnosis not present

## 2018-01-14 DIAGNOSIS — R0602 Shortness of breath: Secondary | ICD-10-CM | POA: Insufficient documentation

## 2018-01-14 DIAGNOSIS — I25119 Atherosclerotic heart disease of native coronary artery with unspecified angina pectoris: Secondary | ICD-10-CM | POA: Diagnosis not present

## 2018-01-14 HISTORY — DX: Gastro-esophageal reflux disease without esophagitis: K21.9

## 2018-01-14 HISTORY — DX: Hyperlipidemia, unspecified: E78.5

## 2018-01-14 HISTORY — DX: Age-related osteoporosis without current pathological fracture: M81.0

## 2018-01-14 HISTORY — DX: Diffuse cystic mastopathy of unspecified breast: N60.19

## 2018-01-14 HISTORY — PX: LEFT HEART CATH AND CORONARY ANGIOGRAPHY: CATH118249

## 2018-01-14 SURGERY — LEFT HEART CATH AND CORONARY ANGIOGRAPHY
Anesthesia: Moderate Sedation | Laterality: Left

## 2018-01-14 MED ORDER — SODIUM CHLORIDE 0.9 % IV SOLN: 250.0000 mL | INTRAVENOUS | Status: DC | PRN

## 2018-01-14 MED ORDER — HEPARIN (PORCINE) IN NACL 1000-0.9 UT/500ML-% IV SOLN
INTRAVENOUS | Status: AC
  Filled 2018-01-14: qty 1000

## 2018-01-14 MED ORDER — ONDANSETRON HCL 4 MG/2ML IJ SOLN: 4.0000 mg | Freq: Four times a day (QID) | INTRAMUSCULAR | Status: DC | PRN

## 2018-01-14 MED ORDER — ATORVASTATIN CALCIUM 80 MG PO TABS: 80.0000 mg | ORAL_TABLET | Freq: Every day | ORAL | 11 refills | Status: AC

## 2018-01-14 MED ORDER — LIDOCAINE HCL (PF) 1 % IJ SOLN
INTRAMUSCULAR | Status: AC
  Filled 2018-01-14: qty 30

## 2018-01-14 MED ORDER — IOPAMIDOL (ISOVUE-300) INJECTION 61%
INTRAVENOUS | Status: DC | PRN
  Administered 2018-01-14: 130 mL via INTRA_ARTERIAL

## 2018-01-14 MED ORDER — SODIUM CHLORIDE 0.9 % WEIGHT BASED INFUSION: 1.0000 mL/kg/h | INTRAVENOUS | Status: DC

## 2018-01-14 MED ORDER — ACETAMINOPHEN 325 MG PO TABS: 650.0000 mg | ORAL_TABLET | ORAL | Status: DC | PRN

## 2018-01-14 MED ORDER — SODIUM CHLORIDE 0.9% FLUSH: 3.0000 mL | INTRAVENOUS | Status: DC | PRN

## 2018-01-14 MED ORDER — MIDAZOLAM HCL 2 MG/2ML IJ SOLN
INTRAMUSCULAR | Status: AC
  Filled 2018-01-14: qty 2

## 2018-01-14 MED ORDER — MIDAZOLAM HCL 2 MG/2ML IJ SOLN
INTRAMUSCULAR | Status: DC | PRN
  Administered 2018-01-14: 1 mg via INTRAVENOUS

## 2018-01-14 MED ORDER — SODIUM CHLORIDE 0.9% FLUSH: 3.0000 mL | Freq: Two times a day (BID) | INTRAVENOUS | Status: DC

## 2018-01-14 MED ORDER — SODIUM CHLORIDE 0.9 % WEIGHT BASED INFUSION
3.0000 mL/kg/h | INTRAVENOUS | Status: AC
  Administered 2018-01-14: 3 mL/kg/h via INTRAVENOUS

## 2018-01-14 MED ORDER — FENTANYL CITRATE (PF) 100 MCG/2ML IJ SOLN
INTRAMUSCULAR | Status: AC
  Filled 2018-01-14: qty 2

## 2018-01-14 MED ORDER — ASPIRIN 81 MG PO CHEW: 81.0000 mg | CHEWABLE_TABLET | ORAL | Status: DC

## 2018-01-14 MED ORDER — FENTANYL CITRATE (PF) 100 MCG/2ML IJ SOLN
INTRAMUSCULAR | Status: DC | PRN
  Administered 2018-01-14: 25 ug via INTRAVENOUS

## 2018-01-14 SURGICAL SUPPLY — 10 items
CATH INFINITI 5FR ANG PIGTAIL (CATHETERS) ×2 IMPLANT
CATH INFINITI 5FR JL4 (CATHETERS) ×2 IMPLANT
CATH INFINITI JR4 5F (CATHETERS) ×2 IMPLANT
DEVICE CLOSURE MYNXGRIP 5F (Vascular Products) ×2 IMPLANT
KIT MANI 3VAL PERCEP (MISCELLANEOUS) ×3 IMPLANT
NDL PERC 18GX7CM (NEEDLE) IMPLANT
NEEDLE PERC 18GX7CM (NEEDLE) ×3 IMPLANT
PACK CARDIAC CATH (CUSTOM PROCEDURE TRAY) ×3 IMPLANT
SHEATH AVANTI 5FR X 11CM (SHEATH) ×2 IMPLANT
WIRE GUIDERIGHT .035X150 (WIRE) ×2 IMPLANT

## 2018-01-14 NOTE — Progress Notes (Signed)
Pt with increasing SBP prior to d/c. Right groin CDI. Dr. Gwen PoundsKowalski paged and updated on Pt status. Per MD ok to d/c with SBP and advised Pt to take meds once at home. Pt agrees to take meds, d/c home

## 2018-01-20 ENCOUNTER — Ambulatory Visit
Admission: RE | Admit: 2018-01-20 | Discharge: 2018-01-20 | Disposition: A | Discharge status: Home / Self Care | Payer: Medicare Other | Source: Ambulatory Visit | Attending: Family Medicine | Admitting: Family Medicine

## 2018-01-20 DIAGNOSIS — Z1231 Encounter for screening mammogram for malignant neoplasm of breast: Secondary | ICD-10-CM

## 2018-01-22 DIAGNOSIS — I2510 Atherosclerotic heart disease of native coronary artery without angina pectoris: Secondary | ICD-10-CM | POA: Insufficient documentation

## 2018-01-26 ENCOUNTER — Other Ambulatory Visit: Payer: Self-pay | Admitting: Family Medicine

## 2018-01-26 DIAGNOSIS — R928 Other abnormal and inconclusive findings on diagnostic imaging of breast: Secondary | ICD-10-CM

## 2018-01-26 DIAGNOSIS — R921 Mammographic calcification found on diagnostic imaging of breast: Secondary | ICD-10-CM

## 2018-02-02 ENCOUNTER — Ambulatory Visit: Payer: Medicare Other

## 2018-02-02 ENCOUNTER — Other Ambulatory Visit: Payer: Medicare Other

## 2018-02-06 DIAGNOSIS — Z951 Presence of aortocoronary bypass graft: Secondary | ICD-10-CM | POA: Insufficient documentation

## 2018-02-06 DIAGNOSIS — I6523 Occlusion and stenosis of bilateral carotid arteries: Secondary | ICD-10-CM | POA: Insufficient documentation

## 2018-04-02 ENCOUNTER — Ambulatory Visit: Payer: Medicare Other

## 2018-04-02 ENCOUNTER — Encounter: Payer: Self-pay | Admitting: *Deleted

## 2018-04-02 ENCOUNTER — Encounter: Payer: Medicare Other | Attending: Internal Medicine | Admitting: *Deleted

## 2018-04-02 VITALS — Ht 64.5 in | Wt 125.1 lb

## 2018-04-02 DIAGNOSIS — E785 Hyperlipidemia, unspecified: Secondary | ICD-10-CM | POA: Insufficient documentation

## 2018-04-02 DIAGNOSIS — Z951 Presence of aortocoronary bypass graft: Secondary | ICD-10-CM | POA: Diagnosis not present

## 2018-04-02 DIAGNOSIS — M1990 Unspecified osteoarthritis, unspecified site: Secondary | ICD-10-CM | POA: Insufficient documentation

## 2018-04-02 DIAGNOSIS — M810 Age-related osteoporosis without current pathological fracture: Secondary | ICD-10-CM | POA: Insufficient documentation

## 2018-04-02 DIAGNOSIS — K219 Gastro-esophageal reflux disease without esophagitis: Secondary | ICD-10-CM | POA: Insufficient documentation

## 2018-04-02 NOTE — Progress Notes (Signed)
Daily Session Note  Patient Details  Name: Darlene Barry MRN: [id number] Date of Birth: 06/22/1931 Referring Provider:     Cardiac Rehab from 04/02/2018 in [facility]  Referring Provider  Kowalski      Encounter Date: 04/02/2018  Check In: Session Check In - 04/02/18 1452      Check-In   Supervising physician immediately available to respond to emergencies  See telemetry face sheet for immediately available ER MD    Location  [facility]    Staff Present  Meredith Craven, RN BSN;Amanda Sommer, BA, ACSM CEP, Exercise Physiologist    Warm-up and Cool-down  Not performed (comment)   MED REVIEW   Resistance Training Performed  Yes    VAD Patient?  No    PAD/SET Patient?  No      Pain Assessment   Currently in Pain?  No/denies          Social History   Tobacco Use  Smoking Status Never Smoker  Smokeless Tobacco Never Used    Goals Met:  Proper associated with RPD/PD & O2 Sat Exercise tolerated well Personal goals reviewed No report of cardiac concerns or symptoms Strength training completed today  Goals Unmet:  Not Applicable  Comments: Med Review completed   Dr. Mark Miller is Medical Director for [facility] and [facility]. 

## 2018-04-02 NOTE — Patient Instructions (Signed)
Patient Instructions  Patient Details  Name: Darlene Barry MRN: [id number] Date of Birth: 1932/04/06 Referring Provider:  Lamar Blinks, MD  Below are your personal goals for exercise, nutrition, and risk factors. Our goal is to help you stay on track towards obtaining and maintaining these goals. We will be discussing your progress on these goals with you throughout the program.  Initial Exercise Prescription: Initial Exercise Prescription - 04/02/18 1400      Date of Initial Exercise RX and Referring Provider   Date  04/02/18    Referring Provider  Gwen Pounds      Treadmill   MPH  2    Grade  0    Minutes  15    METs  2.5      NuStep   Level  2    SPM  80    Minutes  15    METs  2.2      Biostep-RELP   Level  3    SPM  50    Minutes  15    METs  2      Prescription Details   Frequency (times per week)  2    Duration  Progress to 30 minutes of continuous aerobic without signs/symptoms of physical distress      Intensity   THRR 40-80% of Max Heartrate  100-124    Ratings of Perceived Exertion  11-13    Perceived Dyspnea  0-4      Resistance Training   Training Prescription  Yes    Weight  2 lb    Reps  10-15       Exercise Goals: Frequency: Be able to perform aerobic exercise two to three times per week in program working toward 2-5 days per week of home exercise.  Intensity: Work with a perceived exertion of 11 (fairly light) - 15 (hard) while following your exercise prescription.  We will make changes to your prescription with you as you progress through the program.   Duration: Be able to do 30 to 45 minutes of continuous aerobic exercise in addition to a 5 minute warm-up and a 5 minute cool-down routine.   Nutrition Goals: Your personal nutrition goals will be established when you do your nutrition analysis with the dietician.  The following are general nutrition guidelines to follow: Cholesterol < 200mg /day Sodium < 1500mg /day Fiber: Women over 50  yrs - 21 grams per day  Personal Goals: Personal Goals and Risk Factors at Admission - 04/02/18 1504      Core Components/Risk Factors/Patient Goals on Admission    Weight Management  Yes;Weight Maintenance    Intervention  Weight Management: Develop a combined nutrition and exercise program designed to reach desired caloric intake, while maintaining appropriate intake of nutrient and fiber, sodium and fats, and appropriate energy expenditure required for the weight goal.;Weight Management: Provide education and appropriate resources to help participant work on and attain dietary goals.    Admit Weight  125 lb (56.7 kg)    Expected Outcomes  Short Term: Continue to assess and modify interventions until short term weight is achieved;Weight Maintenance: Understanding of the daily nutrition guidelines, which includes 25-35% calories from fat, 7% or less cal from saturated fats, less than 200mg  cholesterol, less than 1.5gm of sodium, & 5 or more servings of fruits and vegetables daily;Long Term: Adherence to nutrition and physical activity/exercise program aimed toward attainment of established weight goal;Understanding recommendations for meals to include 15-35% energy as protein, 25-35% energy from fat,  35-60% energy from carbohydrates, less than 200mg  of dietary cholesterol, 20-35 gm of total fiber daily;Understanding of distribution of calorie intake throughout the day with the consumption of 4-5 meals/snacks    Lipids  Yes    Intervention  Provide education and support for participant on nutrition & aerobic/resistive exercise along with prescribed medications to achieve LDL 70mg , HDL >40mg .    Expected Outcomes  Short Term: Participant states understanding of desired cholesterol values and is compliant with medications prescribed. Participant is following exercise prescription and nutrition guidelines.;Long Term: Cholesterol controlled with medications as prescribed, with individualized exercise RX and  with personalized nutrition plan. Value goals: LDL < 70mg , HDL > 40 mg.       Tobacco Use Initial Evaluation: Social History   Tobacco Use  Smoking Status Never Smoker  Smokeless Tobacco Never Used    Exercise Goals and Review: Exercise Goals    Row Name 04/02/18 1418             Exercise Goals   Increase Physical Activity  Yes       Intervention  Provide advice, education, support and counseling about physical activity/exercise needs.;Develop an individualized exercise prescription for aerobic and resistive training based on initial evaluation findings, risk stratification, comorbidities and participant's personal goals.       Expected Outcomes  Short Term: Attend rehab on a regular basis to increase amount of physical activity.;Long Term: Add in home exercise to make exercise part of routine and to increase amount of physical activity.;Long Term: Exercising regularly at least 3-5 days a week.       Increase Strength and Stamina  Yes       Intervention  Provide advice, education, support and counseling about physical activity/exercise needs.;Develop an individualized exercise prescription for aerobic and resistive training based on initial evaluation findings, risk stratification, comorbidities and participant's personal goals.       Expected Outcomes  Short Term: Increase workloads from initial exercise prescription for resistance, speed, and METs.;Short Term: Perform resistance training exercises routinely during rehab and add in resistance training at home;Long Term: Improve cardiorespiratory fitness, muscular endurance and strength as measured by increased METs and functional capacity (6MWT)       Able to understand and use rate of perceived exertion (RPE) scale  Yes       Intervention  Provide education and explanation on how to use RPE scale       Expected Outcomes  Short Term: Able to use RPE daily in rehab to express subjective intensity level;Long Term:  Able to use RPE to  guide intensity level when exercising independently       Able to understand and use Dyspnea scale  Yes       Intervention  Provide education and explanation on how to use Dyspnea scale       Expected Outcomes  Short Term: Able to use Dyspnea scale daily in rehab to express subjective sense of shortness of breath during exertion;Long Term: Able to use Dyspnea scale to guide intensity level when exercising independently       Knowledge and understanding of Target Heart Rate Range (THRR)  Yes       Intervention  Provide education and explanation of THRR including how the numbers were predicted and where they are located for reference       Expected Outcomes  Short Term: Able to state/look up THRR;Short Term: Able to use daily as guideline for intensity in rehab;Long Term: Able to use THRR to  govern intensity when exercising independently       Able to check pulse independently  Yes       Intervention  Provide education and demonstration on how to check pulse in carotid and radial arteries.;Review the importance of being able to check your own pulse for safety during independent exercise       Expected Outcomes  Short Term: Able to explain why pulse checking is important during independent exercise;Long Term: Able to check pulse independently and accurately       Understanding of Exercise Prescription  Yes       Intervention  Provide education, explanation, and written materials on patient's individual exercise prescription       Expected Outcomes  Short Term: Able to explain program exercise prescription;Long Term: Able to explain home exercise prescription to exercise independently          Copy of goals given to participant.

## 2018-04-02 NOTE — Progress Notes (Signed)
Cardiac Individual Treatment Plan  Patient Details  Name: Darlene Barry MRN: [id number] Date of Birth: 1932/02/10 Referring Provider:     Cardiac Rehab from 04/02/2018 in [facility] Cardiac and Pulmonary Rehab  Referring Provider  Nehemiah Massed      Initial Encounter Date:    Cardiac Rehab from 04/02/2018 in [facility] Cardiac and Pulmonary Rehab  Date  04/02/18      Visit Diagnosis: S/P CABG x 3  Patient's Home Medications on Admission:  Current Outpatient Medications:  .  aspirin EC 81 MG tablet, Take 81 mg by mouth daily., Disp: , Rfl:  .  atorvastatin (LIPITOR) 80 MG tablet, Take 1 tablet (80 mg total) by mouth daily., Disp: 30 tablet, Rfl: 11 .  Calcium-Phosphorus-Vitamin D (CITRACAL +D3 PO), Take 1 tablet by mouth daily., Disp: , Rfl:  .  Cholecalciferol (VITAMIN D) 2000 units CAPS, Take 2,000 Units by mouth daily., Disp: , Rfl:  .  clindamycin (CLEOCIN) 300 MG capsule, Take 600 mg by mouth See admin instructions. Take 600 mg by mouth 1 hour prior to dental appointment, Disp: , Rfl:  .  denosumab (PROLIA) 60 MG/ML SOSY injection, Inject 60 mg into the skin every 6 (six) months., Disp: , Rfl:  .  diphenhydramine-acetaminophen (TYLENOL PM) 25-500 MG TABS tablet, Take 0.5 tablets by mouth at bedtime., Disp: , Rfl:  .  docusate sodium (COLACE) 100 MG capsule, Take 1 capsule (100 mg total) by mouth 2 (two) times daily. (Patient taking differently: Take 100 mg by mouth at bedtime. ), Disp: 10 capsule, Rfl: 0 .  esomeprazole (NEXIUM) 20 MG capsule, Take 40 mg by mouth daily. , Disp: , Rfl:  .  gabapentin (NEURONTIN) 100 MG capsule, Take 100 mg by mouth See admin instructions. Take 100 mg by mouth in the morning and take 200 mg by mouth at bedtime, Disp: , Rfl:  .  isosorbide mononitrate (IMDUR) 30 MG 24 hr tablet, Take 30 mg by mouth daily., Disp: , Rfl:  .  metoprolol succinate (TOPROL-XL) 25 MG 24 hr tablet, Take 25 mg by mouth daily., Disp: , Rfl:  .  Multiple Vitamins-Minerals (PRESERVISION AREDS 2  PO), Take 1 capsule by mouth 2 (two) times daily., Disp: , Rfl:  .  Sennosides (EX-LAX PO), Take 25 mg by mouth at bedtime. , Disp: , Rfl:  .  sennosides-docusate sodium (SENOKOT-S) 8.6-50 MG tablet, Take 1 tablet by mouth at bedtime. , Disp: , Rfl:  .  ferrous sulfate 325 (65 FE) MG tablet, Take 1 tablet (325 mg total) by mouth 3 (three) times daily after meals. (Patient not taking: Reported on 04/02/2018), Disp: , Rfl: 3 .  HYDROcodone-acetaminophen (NORCO) 7.5-325 MG tablet, Take 1-2 tablets by mouth every 4 (four) hours as needed for moderate pain. (Patient not taking: Reported on 01/07/2018), Disp: 60 tablet, Rfl: 0 .  methocarbamol (ROBAXIN) 500 MG tablet, Take 1 tablet (500 mg total) by mouth every 6 (six) hours as needed for muscle spasms. (Patient not taking: Reported on 01/07/2018), Disp: 30 tablet, Rfl: 0 .  polyethylene glycol (MIRALAX / GLYCOLAX) packet, Take 17 g by mouth 2 (two) times daily. (Patient not taking: Reported on 01/07/2018), Disp: 14 each, Rfl: 0  Past Medical History: Past Medical History:  Diagnosis Date  . Arthritis   . Detached retina   . Fibrocystic breast disease   . GERD (gastroesophageal reflux disease)   . Hyperlipidemia   . Macular degeneration   . Osteoporosis     Tobacco Use: Social History  Tobacco Use  Smoking Status Never Smoker  Smokeless Tobacco Never Used    Labs: Recent Review Flowsheet Data    There is no flowsheet data to display.       Exercise Target Goals: Exercise Program Goal: Individual exercise prescription set using results from initial 6 min walk test and THRR while considering  patient's activity barriers and safety.   Exercise Prescription Goal: Initial exercise prescription builds to 30-45 minutes a day of aerobic activity, 2-3 days per week.  Home exercise guidelines will be given to patient during program as part of exercise prescription that the participant will acknowledge.  Activity Barriers & Risk  Stratification: Activity Barriers & Cardiac Risk Stratification - 04/02/18 1510      Activity Barriers & Cardiac Risk Stratification   Activity Barriers  Shortness of Breath    Cardiac Risk Stratification  High       6 Minute Walk: 6 Minute Walk    Row Name 04/02/18 1419         6 Minute Walk   Phase  Initial     Distance  1100 feet     Walk Time  6 minutes     # of Rest Breaks  0     MPH  2.08     METS  2.24     Symptoms  No     Resting HR  77 bpm     Resting BP  140/66     Resting Oxygen Saturation   100 %     Exercise Oxygen Saturation  during 6 min walk  100 %     Max Ex. HR  109 bpm     Max Ex. BP  140/68     2 Minute Post BP  122/70        Oxygen Initial Assessment:   Oxygen Re-Evaluation:   Oxygen Discharge (Final Oxygen Re-Evaluation):   Initial Exercise Prescription: Initial Exercise Prescription - 04/02/18 1400      Date of Initial Exercise RX and Referring Provider   Date  04/02/18    Referring Provider  Nehemiah Massed      Treadmill   MPH  2    Grade  0    Minutes  15    METs  2.5      NuStep   Level  2    SPM  80    Minutes  15    METs  2.2      Biostep-RELP   Level  3    SPM  50    Minutes  15    METs  2      Prescription Details   Frequency (times per week)  2    Duration  Progress to 30 minutes of continuous aerobic without signs/symptoms of physical distress      Intensity   THRR 40-80% of Max Heartrate  100-124    Ratings of Perceived Exertion  11-13    Perceived Dyspnea  0-4      Resistance Training   Training Prescription  Yes    Weight  2 lb    Reps  10-15       Perform Capillary Blood Glucose checks as needed.  Exercise Prescription Changes:   Exercise Comments:   Exercise Goals and Review: Exercise Goals    Row Name 04/02/18 1418             Exercise Goals   Increase Physical Activity  Yes  Intervention  Provide advice, education, support and counseling about physical activity/exercise  needs.;Develop an individualized exercise prescription for aerobic and resistive training based on initial evaluation findings, risk stratification, comorbidities and participant's personal goals.       Expected Outcomes  Short Term: Attend rehab on a regular basis to increase amount of physical activity.;Long Term: Add in home exercise to make exercise part of routine and to increase amount of physical activity.;Long Term: Exercising regularly at least 3-5 days a week.       Increase Strength and Stamina  Yes       Intervention  Provide advice, education, support and counseling about physical activity/exercise needs.;Develop an individualized exercise prescription for aerobic and resistive training based on initial evaluation findings, risk stratification, comorbidities and participant's personal goals.       Expected Outcomes  Short Term: Increase workloads from initial exercise prescription for resistance, speed, and METs.;Short Term: Perform resistance training exercises routinely during rehab and add in resistance training at home;Long Term: Improve cardiorespiratory fitness, muscular endurance and strength as measured by increased METs and functional capacity (6MWT)       Able to understand and use rate of perceived exertion (RPE) scale  Yes       Intervention  Provide education and explanation on how to use RPE scale       Expected Outcomes  Short Term: Able to use RPE daily in rehab to express subjective intensity level;Long Term:  Able to use RPE to guide intensity level when exercising independently       Able to understand and use Dyspnea scale  Yes       Intervention  Provide education and explanation on how to use Dyspnea scale       Expected Outcomes  Short Term: Able to use Dyspnea scale daily in rehab to express subjective sense of shortness of breath during exertion;Long Term: Able to use Dyspnea scale to guide intensity level when exercising independently       Knowledge and  understanding of Target Heart Rate Range (THRR)  Yes       Intervention  Provide education and explanation of THRR including how the numbers were predicted and where they are located for reference       Expected Outcomes  Short Term: Able to state/look up THRR;Short Term: Able to use daily as guideline for intensity in rehab;Long Term: Able to use THRR to govern intensity when exercising independently       Able to check pulse independently  Yes       Intervention  Provide education and demonstration on how to check pulse in carotid and radial arteries.;Review the importance of being able to check your own pulse for safety during independent exercise       Expected Outcomes  Short Term: Able to explain why pulse checking is important during independent exercise;Long Term: Able to check pulse independently and accurately       Understanding of Exercise Prescription  Yes       Intervention  Provide education, explanation, and written materials on patient's individual exercise prescription       Expected Outcomes  Short Term: Able to explain program exercise prescription;Long Term: Able to explain home exercise prescription to exercise independently          Exercise Goals Re-Evaluation :   Discharge Exercise Prescription (Final Exercise Prescription Changes):   Nutrition:  Target Goals: Understanding of nutrition guidelines, daily intake of sodium <1557m, cholesterol <201m calories 30% from  fat and 7% or less from saturated fats, daily to have 5 or more servings of fruits and vegetables.  Biometrics: Pre Biometrics - 04/02/18 1417      Pre Biometrics   Height  5' 4.5" (1.638 m)    Weight  125 lb 1.6 oz (56.7 kg)    Waist Circumference  29 inches    Hip Circumference  37 inches    Waist to Hip Ratio  0.78 %    BMI (Calculated)  21.15        Nutrition Therapy Plan and Nutrition Goals: Nutrition Therapy & Goals - 04/02/18 1510      Intervention Plan   Intervention  Prescribe,  educate and counsel regarding individualized specific dietary modifications aiming towards targeted core components such as weight, hypertension, lipid management, diabetes, heart failure and other comorbidities.;Nutrition handout(s) given to patient.    Expected Outcomes  Short Term Goal: Understand basic principles of dietary content, such as calories, fat, sodium, cholesterol and nutrients.;Short Term Goal: A plan has been developed with personal nutrition goals set during dietitian appointment.;Long Term Goal: Adherence to prescribed nutrition plan.       Nutrition Assessments: Nutrition Assessments - 04/02/18 1510      MEDFICTS Scores   Pre Score  38       Nutrition Goals Re-Evaluation:   Nutrition Goals Discharge (Final Nutrition Goals Re-Evaluation):   Psychosocial: Target Goals: Acknowledge presence or absence of significant depression and/or stress, maximize coping skills, provide positive support system. Participant is able to verbalize types and ability to use techniques and skills needed for reducing stress and depression.   Initial Review & Psychosocial Screening: Initial Psych Review & Screening - 04/02/18 1506      Initial Review   Current issues with  Current Stress Concerns    Source of Stress Concerns  Family    Comments  Khalea's husband has dementia. He still drives some. She was thankful she had family stay during her post op time to help out. Now it is back to just the two of them which she thinks will help him get back on track. He sleeps all night which means she can sleep all night. She is a very active 84 year old, loves to volunteer at the [employer] and hopes to get back to her 2 days a week come January.       Family Dynamics   Good Support System?  Yes   spouse, children     Barriers   Psychosocial barriers to participate in program  The patient should benefit from training in stress management and relaxation.      Screening  Interventions   Interventions  Encouraged to exercise;Program counselor consult;To provide support and resources with identified psychosocial needs;Provide feedback about the scores to participant    Expected Outcomes  Short Term goal: Utilizing psychosocial counselor, staff and physician to assist with identification of specific Stressors or current issues interfering with healing process. Setting desired goal for each stressor or current issue identified.;Long Term Goal: Stressors or current issues are controlled or eliminated.;Short Term goal: Identification and review with participant of any Quality of Life or Depression concerns found by scoring the questionnaire.;Long Term goal: The participant improves quality of Life and PHQ9 Scores as seen by post scores and/or verbalization of changes       Quality of Life Scores:  Quality of Life - 04/02/18 1509      Quality of Life   Select  Quality  of Life      Quality of Life Scores   Health/Function Pre  24.96 %    Socioeconomic Pre  30 %    Psych/Spiritual Pre  26.75 %    Family Pre  28.8 %    GLOBAL Pre  26.75 %      Scores of 19 and below usually indicate a poorer quality of life in these areas.  A difference of  2-3 points is a clinically meaningful difference.  A difference of 2-3 points in the total score of the Quality of Life Index has been associated with significant improvement in overall quality of life, self-image, physical symptoms, and general health in studies assessing change in quality of life.  PHQ-9: Recent Review Flowsheet Data    Depression screen [facility] 2/9 04/02/2018   Decreased Interest 0   Down, Depressed, Hopeless 0   PHQ - 2 Score 0   Altered sleeping 1   Tired, decreased energy 1   Change in appetite 0   Feeling bad or failure about yourself  0   Trouble concentrating 0   Moving slowly or fidgety/restless 0   Suicidal thoughts 0   PHQ-9 Score 2   Difficult doing work/chores Not difficult at all      Interpretation of Total Score  Total Score Depression Severity:  1-4 = Minimal depression, 5-9 = Mild depression, 10-14 = Moderate depression, 15-19 = Moderately severe depression, 20-27 = Severe depression   Psychosocial Evaluation and Intervention:   Psychosocial Re-Evaluation:   Psychosocial Discharge (Final Psychosocial Re-Evaluation):   Vocational Rehabilitation: Provide vocational rehab assistance to qualifying candidates.   Vocational Rehab Evaluation & Intervention: Vocational Rehab - 04/02/18 1506      Initial Vocational Rehab Evaluation & Intervention   Assessment shows need for Vocational Rehabilitation  No       Education: Education Goals: Education classes will be provided on a variety of topics geared toward better understanding of heart health and risk factor modification. Participant will state understanding/return demonstration of topics presented as noted by education test scores.  Learning Barriers/Preferences: Learning Barriers/Preferences - 04/02/18 1505      Learning Barriers/Preferences   Learning Barriers  None    Learning Preferences  Individual Instruction;Verbal Instruction       Education Topics:  AED/CPR: - Group verbal and written instruction with the use of models to demonstrate the basic use of the AED with the basic ABC's of resuscitation.   General Nutrition Guidelines/Fats and Fiber: -Group instruction provided by verbal, written material, models and posters to present the general guidelines for heart healthy nutrition. Gives an explanation and review of dietary fats and fiber.   Controlling Sodium/Reading Food Labels: -Group verbal and written material supporting the discussion of sodium use in heart healthy nutrition. Review and explanation with models, verbal and written materials for utilization of the food label.   Exercise Physiology & General Exercise Guidelines: - Group verbal and written instruction with models to  review the exercise physiology of the cardiovascular system and associated critical values. Provides general exercise guidelines with specific guidelines to those with heart or lung disease.    Aerobic Exercise & Resistance Training: - Gives group verbal and written instruction on the various components of exercise. Focuses on aerobic and resistive training programs and the benefits of this training and how to safely progress through these programs..   Flexibility, Balance, Mind/Body Relaxation: Provides group verbal/written instruction on the benefits of flexibility and balance training, including mind/body exercise modes such  as yoga, pilates and tai chi.  Demonstration and skill practice provided.   Stress and Anxiety: - Provides group verbal and written instruction about the health risks of elevated stress and causes of high stress.  Discuss the correlation between heart/lung disease and anxiety and treatment options. Review healthy ways to manage with stress and anxiety.   Depression: - Provides group verbal and written instruction on the correlation between heart/lung disease and depressed mood, treatment options, and the stigmas associated with seeking treatment.   Anatomy & Physiology of the Heart: - Group verbal and written instruction and models provide basic cardiac anatomy and physiology, with the coronary electrical and arterial systems. Review of Valvular disease and Heart Failure   Cardiac Procedures: - Group verbal and written instruction to review commonly prescribed medications for heart disease. Reviews the medication, class of the drug, and side effects. Includes the steps to properly store meds and maintain the prescription regimen. (beta blockers and nitrates)   Cardiac Medications I: - Group verbal and written instruction to review commonly prescribed medications for heart disease. Reviews the medication, class of the drug, and side effects. Includes the steps to  properly store meds and maintain the prescription regimen.   Cardiac Medications II: -Group verbal and written instruction to review commonly prescribed medications for heart disease. Reviews the medication, class of the drug, and side effects. (all other drug classes)    Go Sex-Intimacy & Heart Disease, Get SMART - Goal Setting: - Group verbal and written instruction through game format to discuss heart disease and the return to sexual intimacy. Provides group verbal and written material to discuss and apply goal setting through the application of the S.M.A.R.T. Method.   Other Matters of the Heart: - Provides group verbal, written materials and models to describe Stable Angina and Peripheral Artery. Includes description of the disease process and treatment options available to the cardiac patient.   Exercise & Equipment Safety: - Individual verbal instruction and demonstration of equipment use and safety with use of the equipment.   Cardiac Rehab from 04/02/2018 in [facility] Cardiac and Pulmonary Rehab  Date  04/02/18  Educator  [facility]  Instruction Review Code  1- Verbalizes Understanding      Infection Prevention: - Provides verbal and written material to individual with discussion of infection control including proper hand washing and proper equipment cleaning during exercise session.   Cardiac Rehab from 04/02/2018 in [facility] Cardiac and Pulmonary Rehab  Date  04/02/18  Educator  [facility]  Instruction Review Code  1- Verbalizes Understanding      Falls Prevention: - Provides verbal and written material to individual with discussion of falls prevention and safety.   Cardiac Rehab from 04/02/2018 in [facility] Cardiac and Pulmonary Rehab  Date  04/02/18  Educator  [facility]  Instruction Review Code  1- Verbalizes Understanding      Diabetes: - Individual verbal and written instruction to review signs/symptoms of diabetes, desired ranges of glucose level fasting, after meals and with exercise.  Acknowledge that pre and post exercise glucose checks will be done for 3 sessions at entry of program.   Know Your Numbers and Risk Factors: -Group verbal and written instruction about important numbers in your health.  Discussion of what are risk factors and how they play a role in the disease process.  Review of Cholesterol, Blood Pressure, Diabetes, and BMI and the role they play in your overall health.   Sleep Hygiene: -Provides group verbal and written instruction about how sleep can  affect your health.  Define sleep hygiene, discuss sleep cycles and impact of sleep habits. Review good sleep hygiene tips.    Other: -Provides group and verbal instruction on various topics (see comments)   Knowledge Questionnaire Score: Knowledge Questionnaire Score - 04/02/18 1505      Knowledge Questionnaire Score   Pre Score  21/26   correct answers reviewed with patient, focus on nutrition, exercise and angina      Core Components/Risk Factors/Patient Goals at Admission: Personal Goals and Risk Factors at Admission - 04/02/18 1504      Core Components/Risk Factors/Patient Goals on Admission    Weight Management  Yes;Weight Maintenance    Intervention  Weight Management: Develop a combined nutrition and exercise program designed to reach desired caloric intake, while maintaining appropriate intake of nutrient and fiber, sodium and fats, and appropriate energy expenditure required for the weight goal.;Weight Management: Provide education and appropriate resources to help participant work on and attain dietary goals.    Admit Weight  125 lb (56.7 kg)    Expected Outcomes  Short Term: Continue to assess and modify interventions until short term weight is achieved;Weight Maintenance: Understanding of the daily nutrition guidelines, which includes 25-35% calories from fat, 7% or less cal from saturated fats, less than 2109m cholesterol, less than 1.5gm of sodium, & 5 or more servings of fruits and  vegetables daily;Long Term: Adherence to nutrition and physical activity/exercise program aimed toward attainment of established weight goal;Understanding recommendations for meals to include 15-35% energy as protein, 25-35% energy from fat, 35-60% energy from carbohydrates, less than 2059mof dietary cholesterol, 20-35 gm of total fiber daily;Understanding of distribution of calorie intake throughout the day with the consumption of 4-5 meals/snacks    Lipids  Yes    Intervention  Provide education and support for participant on nutrition & aerobic/resistive exercise along with prescribed medications to achieve LDL <7027mHDL >93m39m  Expected Outcomes  Short Term: Participant states understanding of desired cholesterol values and is compliant with medications prescribed. Participant is following exercise prescription and nutrition guidelines.;Long Term: Cholesterol controlled with medications as prescribed, with individualized exercise RX and with personalized nutrition plan. Value goals: LDL < 70mg69mL > 40 mg.       Core Components/Risk Factors/Patient Goals Review:    Core Components/Risk Factors/Patient Goals at Discharge (Final Review):    ITP Comments: ITP Comments    Row Name 04/02/18 1453           ITP Comments  Med Review completed. Initial ITP created. Diagnosis can be found in Care Everywhere 9/19          Comments: Initial ITP

## 2018-04-07 DIAGNOSIS — Z951 Presence of aortocoronary bypass graft: Secondary | ICD-10-CM

## 2018-04-07 NOTE — Progress Notes (Signed)
Daily Session Note  Patient Details  Name: KAMYLAH MANZO MRN: [id number] Date of Birth: 02-03-1932 Referring Provider:     Cardiac Rehab from 04/02/2018 in [facility]  Referring Provider  Nehemiah Massed      Encounter Date: 04/07/2018  Check In: Session Check In - 04/07/18 1133      Check-In   Supervising physician immediately available to respond to emergencies  See telemetry face sheet for immediately available ER MD    Location  [facility]-Cardiac & Pulmonary Rehab    Staff Present  Heath Lark, RN, BSN, CCRP;Jessica Granger, MA, RCEP, CCRP, Exercise Physiologist;Illya Gienger BS, Exercise Physiologist;Amanda Oletta Darter, BA, ACSM CEP, Exercise Physiologist    Medication changes reported      No    Fall or balance concerns reported     No    Warm-up and Cool-down  Performed on first and last piece of equipment    Resistance Training Performed  Yes    VAD Patient?  No    PAD/SET Patient?  No      Pain Assessment   Currently in Pain?  No/denies    Multiple Pain Sites  No          Social History   Tobacco Use  Smoking Status Never Smoker  Smokeless Tobacco Never Used    Goals Met:  Personal goals reviewed No report of cardiac concerns or symptoms Strength training completed today  Goals Unmet:  Not Applicable  Comments: First full day of exercise!  Patient was oriented to gym and equipment including functions, settings, policies, and procedures.  Patient's individual exercise prescription and treatment plan were reviewed.  All starting workloads were established based on the results of the 6 minute walk test done at initial orientation visit.  The plan for exercise progression was also introduced and progression will be customized based on patient's performance and goals.    Dr. Emily Filbert is Medical Director for [place] and [facility].

## 2018-04-09 ENCOUNTER — Encounter (INDEPENDENT_AMBULATORY_CARE_PROVIDER_SITE_OTHER): Payer: Medicare Other | Admitting: Ophthalmology

## 2018-04-09 ENCOUNTER — Encounter: Payer: Medicare Other | Admitting: *Deleted

## 2018-04-09 DIAGNOSIS — H35371 Puckering of macula, right eye: Secondary | ICD-10-CM

## 2018-04-09 DIAGNOSIS — H353132 Nonexudative age-related macular degeneration, bilateral, intermediate dry stage: Secondary | ICD-10-CM | POA: Diagnosis not present

## 2018-04-09 DIAGNOSIS — H338 Other retinal detachments: Secondary | ICD-10-CM | POA: Diagnosis not present

## 2018-04-09 DIAGNOSIS — H33301 Unspecified retinal break, right eye: Secondary | ICD-10-CM | POA: Diagnosis not present

## 2018-04-09 DIAGNOSIS — H43813 Vitreous degeneration, bilateral: Secondary | ICD-10-CM

## 2018-04-09 DIAGNOSIS — Z951 Presence of aortocoronary bypass graft: Secondary | ICD-10-CM

## 2018-04-09 NOTE — Progress Notes (Signed)
Daily Session Note  Patient Details  Name: Darlene Barry MRN: [id number] Date of Birth: 28-Nov-1931 Referring Provider:     Cardiac Rehab from 04/02/2018 in [facility] Cardiac and Pul[facility] Rehab  Referring Provider  Nehemiah Massed      Encounter Date: 04/09/2018  Check In: Session Check In - 04/09/18 0908      Check-In   Supervising physician immediately available to respond to emergencies  See telemetry face sheet for immediately available ER MD    Location  [facility]    Staff Present  Gerlene Burdock, RN, BSN;Jeanna Durrell BS, Exercise Physiologist;Lucile Didonato Luan Pulling, MA, RCEP, CCRP, Exercise Physiologist;Joseph Tessie Fass RCP,RRT,BSRT    Medication changes reported      No    Fall or balance concerns reported     No    Warm-up and Cool-down  Performed on first and last piece of equipment    Resistance Training Performed  Yes    VAD Patient?  No    PAD/SET Patient?  No      Pain Assessment   Currently in Pain?  No/denies          Social History   Tobacco Use  Smoking Status Never Smoker  Smokeless Tobacco Never Used    Goals Met:  Independence with exercise equipment Exercise tolerated well No report of cardiac concerns or symptoms Strength training completed today  Goals Unmet:  Not Applicable  Comments: Pt able to follow exercise prescription today without complaint.  Will continue to monitor for progression.    Dr. Emily Filbert is Medical Director for [place] and [facility].

## 2018-04-21 ENCOUNTER — Encounter: Payer: Medicare Other | Attending: Internal Medicine

## 2018-04-21 DIAGNOSIS — Z951 Presence of aortocoronary bypass graft: Secondary | ICD-10-CM | POA: Diagnosis present

## 2018-04-21 NOTE — Progress Notes (Signed)
Daily Session Note  Patient Details  Name: Darlene Barry MRN: [id number] Date of Birth: April 18, 1932 Referring Provider:     Cardiac Rehab from 04/02/2018 in [facility] Cardiac and Pul[facility] Rehab  Referring Provider  Darlene Barry      Encounter Date: 04/21/2018  Check In: Session Check In - 04/21/18 0920      Check-In   Supervising physician immediately available to respond to emergencies  See telemetry face sheet for immediately available ER MD    Location  [facility] Pul[facility]    Staff Present  Heath Lark, RN, BSN, CCRP;Jessica [place], MA, RCEP, CCRP, Exercise Physiologist;Jeanna Durrell BS, Exercise Physiologist    Medication changes reported      No    Fall or balance concerns reported     No    Warm-up and Cool-down  Performed on first and last piece of equipment    Resistance Training Performed  Yes    VAD Patient?  No    PAD/SET Patient?  No      Pain Assessment   Currently in Pain?  No/denies    Multiple Pain Sites  No          Social History   Tobacco Use  Smoking Status Never Smoker  Smokeless Tobacco Never Used    Goals Met:  Independence with exercise equipment Exercise tolerated well No report of cardiac concerns or symptoms Strength training completed today  Goals Unmet:  Not Applicable  Comments: Pt able to follow exercise prescription today without complaint.  Will continue to monitor for progression.   Dr. Emily Filbert is Medical Director for [place] and [facility].

## 2018-04-22 ENCOUNTER — Encounter: Payer: Self-pay | Admitting: *Deleted

## 2018-04-22 DIAGNOSIS — Z951 Presence of aortocoronary bypass graft: Secondary | ICD-10-CM

## 2018-04-22 NOTE — Progress Notes (Signed)
Cardiac Individual Treatment Plan  Patient Details  Name: Darlene Barry MRN: [id number] Date of Birth: Dec 27, 1931 Referring Provider:     Cardiac Rehab from 04/02/2018 in [facility] Cardiac and Pulmonary Rehab  Referring Provider  Nehemiah Massed      Initial Encounter Date:    Cardiac Rehab from 04/02/2018 in [facility] Cardiac and Pulmonary Rehab  Date  04/02/18      Visit Diagnosis: S/P CABG x 3  Patient's Home Medications on Admission:  Current Outpatient Medications:  .  aspirin EC 81 MG tablet, Take 81 mg by mouth daily., Disp: , Rfl:  .  atorvastatin (LIPITOR) 80 MG tablet, Take 1 tablet (80 mg total) by mouth daily., Disp: 30 tablet, Rfl: 11 .  Calcium-Phosphorus-Vitamin D (CITRACAL +D3 PO), Take 1 tablet by mouth daily., Disp: , Rfl:  .  Cholecalciferol (VITAMIN D) 2000 units CAPS, Take 2,000 Units by mouth daily., Disp: , Rfl:  .  clindamycin (CLEOCIN) 300 MG capsule, Take 600 mg by mouth See admin instructions. Take 600 mg by mouth 1 hour prior to dental appointment, Disp: , Rfl:  .  denosumab (PROLIA) 60 MG/ML SOSY injection, Inject 60 mg into the skin every 6 (six) months., Disp: , Rfl:  .  diphenhydramine-acetaminophen (TYLENOL PM) 25-500 MG TABS tablet, Take 0.5 tablets by mouth at bedtime., Disp: , Rfl:  .  docusate sodium (COLACE) 100 MG capsule, Take 1 capsule (100 mg total) by mouth 2 (two) times daily. (Patient taking differently: Take 100 mg by mouth at bedtime. ), Disp: 10 capsule, Rfl: 0 .  esomeprazole (NEXIUM) 20 MG capsule, Take 40 mg by mouth daily. , Disp: , Rfl:  .  ferrous sulfate 325 (65 FE) MG tablet, Take 1 tablet (325 mg total) by mouth 3 (three) times daily after meals. (Patient not taking: Reported on 04/02/2018), Disp: , Rfl: 3 .  gabapentin (NEURONTIN) 100 MG capsule, Take 100 mg by mouth See admin instructions. Take 100 mg by mouth in the morning and take 200 mg by mouth at bedtime, Disp: , Rfl:  .  HYDROcodone-acetaminophen (NORCO) 7.5-325 MG tablet, Take 1-2  tablets by mouth every 4 (four) hours as needed for moderate pain. (Patient not taking: Reported on 01/07/2018), Disp: 60 tablet, Rfl: 0 .  isosorbide mononitrate (IMDUR) 30 MG 24 hr tablet, Take 30 mg by mouth daily., Disp: , Rfl:  .  methocarbamol (ROBAXIN) 500 MG tablet, Take 1 tablet (500 mg total) by mouth every 6 (six) hours as needed for muscle spasms. (Patient not taking: Reported on 01/07/2018), Disp: 30 tablet, Rfl: 0 .  metoprolol succinate (TOPROL-XL) 25 MG 24 hr tablet, Take 25 mg by mouth daily., Disp: , Rfl:  .  Multiple Vitamins-Minerals (PRESERVISION AREDS 2 PO), Take 1 capsule by mouth 2 (two) times daily., Disp: , Rfl:  .  polyethylene glycol (MIRALAX / GLYCOLAX) packet, Take 17 g by mouth 2 (two) times daily. (Patient not taking: Reported on 01/07/2018), Disp: 14 each, Rfl: 0 .  Sennosides (EX-LAX PO), Take 25 mg by mouth at bedtime. , Disp: , Rfl:  .  sennosides-docusate sodium (SENOKOT-S) 8.6-50 MG tablet, Take 1 tablet by mouth at bedtime. , Disp: , Rfl:   Past Medical History: Past Medical History:  Diagnosis Date  . Arthritis   . Detached retina   . Fibrocystic breast disease   . GERD (gastroesophageal reflux disease)   . Hyperlipidemia   . Macular degeneration   . Osteoporosis     Tobacco Use: Social History  Tobacco Use  Smoking Status Never Smoker  Smokeless Tobacco Never Used    Labs: Recent Review Flowsheet Data    There is no flowsheet data to display.       Exercise Target Goals: Exercise Program Goal: Individual exercise prescription set using results from initial 6 min walk test and THRR while considering  patient's activity barriers and safety.   Exercise Prescription Goal: Initial exercise prescription builds to 30-45 minutes a day of aerobic activity, 2-3 days per week.  Home exercise guidelines will be given to patient during program as part of exercise prescription that the participant will acknowledge.  Activity Barriers & Risk  Stratification: Activity Barriers & Cardiac Risk Stratification - 04/02/18 1510      Activity Barriers & Cardiac Risk Stratification   Activity Barriers  Shortness of Breath    Cardiac Risk Stratification  High       6 Minute Walk: 6 Minute Walk    Row Name 04/02/18 1419         6 Minute Walk   Phase  Initial     Distance  1100 feet     Walk Time  6 minutes     # of Rest Breaks  0     MPH  2.08     METS  2.24     Symptoms  No     Resting HR  77 bpm     Resting BP  140/66     Resting Oxygen Saturation   100 %     Exercise Oxygen Saturation  during 6 min walk  100 %     Max Ex. HR  109 bpm     Max Ex. BP  140/68     2 Minute Post BP  122/70        Oxygen Initial Assessment:   Oxygen Re-Evaluation:   Oxygen Discharge (Final Oxygen Re-Evaluation):   Initial Exercise Prescription: Initial Exercise Prescription - 04/02/18 1400      Date of Initial Exercise RX and Referring Provider   Date  04/02/18    Referring Provider  Nehemiah Massed      Treadmill   MPH  2    Grade  0    Minutes  15    METs  2.5      NuStep   Level  2    SPM  80    Minutes  15    METs  2.2      Biostep-RELP   Level  3    SPM  50    Minutes  15    METs  2      Prescription Details   Frequency (times per week)  2    Duration  Progress to 30 minutes of continuous aerobic without signs/symptoms of physical distress      Intensity   THRR 40-80% of Max Heartrate  100-124    Ratings of Perceived Exertion  11-13    Perceived Dyspnea  0-4      Resistance Training   Training Prescription  Yes    Weight  2 lb    Reps  10-15       Perform Capillary Blood Glucose checks as needed.  Exercise Prescription Changes: Exercise Prescription Changes    Row Name 04/13/18 1600             Response to Exercise   Blood Pressure (Admit)  112/60       Blood Pressure (Exercise)  160/74  Blood Pressure (Exit)  114/56       Heart Rate (Admit)  74 bpm       Heart Rate (Exercise)  117 bpm        Heart Rate (Exit)  102 bpm       Rating of Perceived Exertion (Exercise)  15       Symptoms  none       Comments  second full day of exercise       Duration  Continue with 30 min of aerobic exercise without signs/symptoms of physical distress.       Intensity  THRR unchanged         Progression   Progression  Continue to progress workloads to maintain intensity without signs/symptoms of physical distress.       Average METs  2.31         Resistance Training   Training Prescription  Yes       Weight  2 lbs       Reps  10-15         Interval Training   Interval Training  No         Treadmill   MPH  2       Grade  0       Minutes  15       METs  2.53         NuStep   Level  2       Minutes  15       METs  2.1         Biostep-RELP   Level  3       Minutes  15          Exercise Comments: Exercise Comments    Row Name 04/07/18 1135           Exercise Comments  First full day of exercise!  Patient was oriented to gym and equipment including functions, settings, policies, and procedures.  Patient's individual exercise prescription and treatment plan were reviewed.  All starting workloads were established based on the results of the 6 minute walk test done at initial orientation visit.  The plan for exercise progression was also introduced and progression will be customized based on patient's performance and goals.          Exercise Goals and Review: Exercise Goals    Row Name 04/02/18 1418             Exercise Goals   Increase Physical Activity  Yes       Intervention  Provide advice, education, support and counseling about physical activity/exercise needs.;Develop an individualized exercise prescription for aerobic and resistive training based on initial evaluation findings, risk stratification, comorbidities and participant's personal goals.       Expected Outcomes  Short Term: Attend rehab on a regular basis to increase amount of physical activity.;Long  Term: Add in home exercise to make exercise part of routine and to increase amount of physical activity.;Long Term: Exercising regularly at least 3-5 days a week.       Increase Strength and Stamina  Yes       Intervention  Provide advice, education, support and counseling about physical activity/exercise needs.;Develop an individualized exercise prescription for aerobic and resistive training based on initial evaluation findings, risk stratification, comorbidities and participant's personal goals.       Expected Outcomes  Short Term: Increase workloads from initial exercise prescription for resistance, speed, and METs.;Short Term: Perform  resistance training exercises routinely during rehab and add in resistance training at home;Long Term: Improve cardiorespiratory fitness, muscular endurance and strength as measured by increased METs and functional capacity (6MWT)       Able to understand and use rate of perceived exertion (RPE) scale  Yes       Intervention  Provide education and explanation on how to use RPE scale       Expected Outcomes  Short Term: Able to use RPE daily in rehab to express subjective intensity level;Long Term:  Able to use RPE to guide intensity level when exercising independently       Able to understand and use Dyspnea scale  Yes       Intervention  Provide education and explanation on how to use Dyspnea scale       Expected Outcomes  Short Term: Able to use Dyspnea scale daily in rehab to express subjective sense of shortness of breath during exertion;Long Term: Able to use Dyspnea scale to guide intensity level when exercising independently       Knowledge and understanding of Target Heart Rate Range (THRR)  Yes       Intervention  Provide education and explanation of THRR including how the numbers were predicted and where they are located for reference       Expected Outcomes  Short Term: Able to state/look up THRR;Short Term: Able to use daily as guideline for intensity in  rehab;Long Term: Able to use THRR to govern intensity when exercising independently       Able to check pulse independently  Yes       Intervention  Provide education and demonstration on how to check pulse in carotid and radial arteries.;Review the importance of being able to check your own pulse for safety during independent exercise       Expected Outcomes  Short Term: Able to explain why pulse checking is important during independent exercise;Long Term: Able to check pulse independently and accurately       Understanding of Exercise Prescription  Yes       Intervention  Provide education, explanation, and written materials on patient's individual exercise prescription       Expected Outcomes  Short Term: Able to explain program exercise prescription;Long Term: Able to explain home exercise prescription to exercise independently          Exercise Goals Re-Evaluation : Exercise Goals Re-Evaluation    Row Name 04/07/18 1135 04/13/18 1623           Exercise Goal Re-Evaluation   Exercise Goals Review  Increase Physical Activity;Able to understand and use rate of perceived exertion (RPE) scale;Knowledge and understanding of Target Heart Rate Range (THRR);Understanding of Exercise Prescription;Increase Strength and Stamina  Increase Physical Activity;Increase Strength and Stamina;Understanding of Exercise Prescription      Comments  First full day of exercise!  Patient was oriented to gym and equipment including functions, settings, policies, and procedures.  Patient's individual exercise prescription and treatment plan were reviewed.  All starting workloads were established based on the results of the 6 minute walk test done at initial orientation visit.  The plan for exercise progression was also introduced and progression will be customized based on patient's performance and goals.  Everlyn is off to a good start with rehab.  She has completed two full days of exercise.  We will continue to monitor  her progresssion.       Expected Outcomes  Reviewed RPE scale, THR and program  prescription with pt today.  Pt voiced understanding and was given a copy of goals to take home.   Short: Continue to attend regularly.  Long; Continue to follow program prescription.          Discharge Exercise Prescription (Final Exercise Prescription Changes): Exercise Prescription Changes - 04/13/18 1600      Response to Exercise   Blood Pressure (Admit)  112/60    Blood Pressure (Exercise)  160/74    Blood Pressure (Exit)  114/56    Heart Rate (Admit)  74 bpm    Heart Rate (Exercise)  117 bpm    Heart Rate (Exit)  102 bpm    Rating of Perceived Exertion (Exercise)  15    Symptoms  none    Comments  second full day of exercise    Duration  Continue with 30 min of aerobic exercise without signs/symptoms of physical distress.    Intensity  THRR unchanged      Progression   Progression  Continue to progress workloads to maintain intensity without signs/symptoms of physical distress.    Average METs  2.31      Resistance Training   Training Prescription  Yes    Weight  2 lbs    Reps  10-15      Interval Training   Interval Training  No      Treadmill   MPH  2    Grade  0    Minutes  15    METs  2.53      NuStep   Level  2    Minutes  15    METs  2.1      Biostep-RELP   Level  3    Minutes  15       Nutrition:  Target Goals: Understanding of nutrition guidelines, daily intake of sodium <1544m, cholesterol <2040m calories 30% from fat and 7% or less from saturated fats, daily to have 5 or more servings of fruits and vegetables.  Biometrics: Pre Biometrics - 04/02/18 1417      Pre Biometrics   Height  5' 4.5" (1.638 m)    Weight  125 lb 1.6 oz (56.7 kg)    Waist Circumference  29 inches    Hip Circumference  37 inches    Waist to Hip Ratio  0.78 %    BMI (Calculated)  21.15        Nutrition Therapy Plan and Nutrition Goals: Nutrition Therapy & Goals - 04/07/18 1121       Nutrition Therapy   Diet  TLC    Drug/Food Interactions  Statins/Certain Fruits    Protein (specify units)  6oz    Fiber  20 grams    Whole Grain Foods  3 servings   does not typically choose whole grains   Saturated Fats  11 max. grams    Fruits and Vegetables  5 servings/day   8 ideal; eats more vegetables than fruits but eats small portions of all foods   Sodium  2000 grams      Personal Nutrition Goals   Nutrition Goal  Prioritize protein at meal times, especially breakfasts / prior to exercising. For example, rather than a biscuit on its own, add a side of eggs    Personal Goal #2  Because you eat small portions and are trying to regain strength, it would be a good idea to establish an eating routine that allows for smaller/ more frequent meals. This will also be helpful for  your husband who would likely do best with smaller meals / finger foods    Personal Goal #3  Eat one additional serving of fruit per week    Comments  She is trying to regain strengh post sugery. Her husband has dementia and does not remember to eat / does not like to eat without her which presents as a challenge for her. She feels she eats small portions. They typically eat out at [place] each morning for breakfast, eat a light lunch such as a sandwich or soup, and eat both at home and out for dinners. She tries to select a location with vegetables when out 2x/wk and may go to [facility] to get chili the other night. At home she cooks a variety of meats, vegetables, and occasionally starches. Snacks: cheese, cereal, fruit, some desserts with husband. Beverages: [place] lemonade/tea blend, coffee, water with Crystal Light. Traditionally she gravitated towards salty foods but is trying not to add salt to foods now.       Intervention Plan   Intervention  Prescribe, educate and counsel regarding individualized specific dietary modifications aiming towards targeted core components such as weight, hypertension, lipid  management, diabetes, heart failure and other comorbidities.    Expected Outcomes  Short Term Goal: Understand basic principles of dietary content, such as calories, fat, sodium, cholesterol and nutrients.;Short Term Goal: A plan has been developed with personal nutrition goals set during dietitian appointment.;Long Term Goal: Adherence to prescribed nutrition plan.       Nutrition Assessments: Nutrition Assessments - 04/02/18 1510      MEDFICTS Scores   Pre Score  38       Nutrition Goals Re-Evaluation: Nutrition Goals Re-Evaluation    [place] Name 04/07/18 1134             Goals   Nutrition Goal  Because you eat small portions and are trying to regain strength, it would be a good idea to establish an eating routine that allows for smaller/ more frequent meals. This will also be helpful for your husband who would likely do best with smaller meals / finger foods       Comment  She eats 2-3 meals / day currently with the occasional snack. Her husband thrives best on routine, which includes his eating schedule. Each of them fill up quickly at meal times and could benefit from smaller / more frequent meals       Expected Outcome  She will eat on a regular/ more frequent schedule and prioritize protein sources. She will try to provide some finger foods for her husband to encourage wt gain         Personal Goal #2 Re-Evaluation   Personal Goal #2  Eat one additional serving of fruit per week         Personal Goal #3 Re-Evaluation   Personal Goal #3  Prioritize protein at meal times, especially breakfasts/ prior to exercising. For example, rather than a biscuit on its own, add a side of eggs          Nutrition Goals Discharge (Final Nutrition Goals Re-Evaluation): Nutrition Goals Re-Evaluation - 04/07/18 1134      Goals   Nutrition Goal  Because you eat small portions and are trying to regain strength, it would be a good idea to establish an eating routine that allows for smaller/ more  frequent meals. This will also be helpful for your husband who would likely do best with smaller meals / finger foods  Comment  She eats 2-3 meals / day currently with the occasional snack. Her husband thrives best on routine, which includes his eating schedule. Each of them fill up quickly at meal times and could benefit from smaller / more frequent meals    Expected Outcome  She will eat on a regular/ more frequent schedule and prioritize protein sources. She will try to provide some finger foods for her husband to encourage wt gain      Personal Goal #2 Re-Evaluation   Personal Goal #2  Eat one additional serving of fruit per week      Personal Goal #3 Re-Evaluation   Personal Goal #3  Prioritize protein at meal times, especially breakfasts/ prior to exercising. For example, rather than a biscuit on its own, add a side of eggs       Psychosocial: Target Goals: Acknowledge presence or absence of significant depression and/or stress, maximize coping skills, provide positive support system. Participant is able to verbalize types and ability to use techniques and skills needed for reducing stress and depression.   Initial Review & Psychosocial Screening: Initial Psych Review & Screening - 04/02/18 1506      Initial Review   Current issues with  Current Stress Concerns    Source of Stress Concerns  Family    Comments  Carollyn's husband has dementia. He still drives some. She was thankful she had family stay during her post op time to help out. Now it is back to just the two of them which she thinks will help him get back on track. He sleeps all night which means she can sleep all night. She is a very active 84 year old, loves to volunteer at the [employer] and hopes to get back to her 2 days a week come January.       Family Dynamics   Good Support System?  Yes   spouse, children     Barriers   Psychosocial barriers to participate in program  The patient should  benefit from training in stress management and relaxation.      Screening Interventions   Interventions  Encouraged to exercise;Program counselor consult;To provide support and resources with identified psychosocial needs;Provide feedback about the scores to participant    Expected Outcomes  Short Term goal: Utilizing psychosocial counselor, staff and physician to assist with identification of specific Stressors or current issues interfering with healing process. Setting desired goal for each stressor or current issue identified.;Long Term Goal: Stressors or current issues are controlled or eliminated.;Short Term goal: Identification and review with participant of any Quality of Life or Depression concerns found by scoring the questionnaire.;Long Term goal: The participant improves quality of Life and PHQ9 Scores as seen by post scores and/or verbalization of changes       Quality of Life Scores:  Quality of Life - 04/02/18 1509      Quality of Life   Select  Quality of Life      Quality of Life Scores   Health/Function Pre  24.96 %    Socioeconomic Pre  30 %    Psych/Spiritual Pre  26.75 %    Family Pre  28.8 %    GLOBAL Pre  26.75 %      Scores of 19 and below usually indicate a poorer quality of life in these areas.  A difference of  2-3 points is a clinically meaningful difference.  A difference of 2-3 points in the total score of the  Quality of Life Index has been associated with significant improvement in overall quality of life, self-image, physical symptoms, and general health in studies assessing change in quality of life.  PHQ-9: Recent Review Flowsheet Data    Depression screen [facility] 2/9 04/02/2018   Decreased Interest 0   Down, Depressed, Hopeless 0   PHQ - 2 Score 0   Altered sleeping 1   Tired, decreased energy 1   Change in appetite 0   Feeling bad or failure about yourself  0   Trouble concentrating 0   Moving slowly or fidgety/restless 0   Suicidal thoughts 0    PHQ-9 Score 2   Difficult doing work/chores Not difficult at all     Interpretation of Total Score  Total Score Depression Severity:  1-4 = Minimal depression, 5-9 = Mild depression, 10-14 = Moderate depression, 15-19 = Moderately severe depression, 20-27 = Severe depression   Psychosocial Evaluation and Intervention: Psychosocial Evaluation - 04/09/18 1040      Psychosocial Evaluation & Interventions   Interventions  Encouraged to exercise with the program and follow exercise prescription;Stress management education    Comments  Counselor met with Ms. Tomaro [facility]) today for initial psychosocial evaluation.  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  She has a strong support system with a spouse of 55 years; (5) adult children between steps and biological; actively involved in her church and the [employer] for over 20 years and good neighbors!Stanton Kidney reports sleeping well and has a good appetite.  She denies a history of depression or anxiety or any current symptoms and is typically in a positive mood.  Owen has stress in her life with her own health and recovery and that of her spouse who is 43 and has Alzheimer's.  Kenedee has goals for increasing her stamina and strength and wants to get back to her normal activities - including volunteering 8 hours/week.  Staff will follow with her.     Expected Outcomes  Short:  Jacalyn will exercise for her health and for her stress at this time.  Long:  Serenah will develop a routine of positive self-care to be able to manage her stress and own health needs.    Continue Psychosocial Services   Follow up required by staff       Psychosocial Re-Evaluation:   Psychosocial Discharge (Final Psychosocial Re-Evaluation):   Vocational Rehabilitation: Provide vocational rehab assistance to qualifying candidates.   Vocational Rehab Evaluation & Intervention: Vocational Rehab - 04/02/18 1506      Initial Vocational Rehab Evaluation &  Intervention   Assessment shows need for Vocational Rehabilitation  No       Education: Education Goals: Education classes will be provided on a variety of topics geared toward better understanding of heart health and risk factor modification. Participant will state understanding/return demonstration of topics presented as noted by education test scores.  Learning Barriers/Preferences: Learning Barriers/Preferences - 04/02/18 1505      Learning Barriers/Preferences   Learning Barriers  None    Learning Preferences  Individual Instruction;Verbal Instruction       Education Topics:  AED/CPR: - Group verbal and written instruction with the use of models to demonstrate the basic use of the AED with the basic ABC's of resuscitation.   General Nutrition Guidelines/Fats and Fiber: -Group instruction provided by verbal, written material, models and posters to present the general guidelines for heart healthy nutrition. Gives an explanation and review of dietary  fats and fiber.   Controlling Sodium/Reading Food Labels: -Group verbal and written material supporting the discussion of sodium use in heart healthy nutrition. Review and explanation with models, verbal and written materials for utilization of the food label.   Exercise Physiology & General Exercise Guidelines: - Group verbal and written instruction with models to review the exercise physiology of the cardiovascular system and associated critical values. Provides general exercise guidelines with specific guidelines to those with heart or lung disease.    Cardiac Rehab from 04/21/2018 in [facility] Cardiac and Pulmonary Rehab  Date  04/07/18  Educator  [facility]  Instruction Review Code  1- Verbalizes Understanding      Aerobic Exercise & Resistance Training: - Gives group verbal and written instruction on the various components of exercise. Focuses on aerobic and resistive training programs and the benefits of this training and how to  safely progress through these programs..   Cardiac Rehab from 04/21/2018 in [facility] Cardiac and Pulmonary Rehab  Date  04/09/18  Educator  AS  Instruction Review Code  1- Verbalizes Understanding      Flexibility, Balance, Mind/Body Relaxation: Provides group verbal/written instruction on the benefits of flexibility and balance training, including mind/body exercise modes such as yoga, pilates and tai chi.  Demonstration and skill practice provided.   Stress and Anxiety: - Provides group verbal and written instruction about the health risks of elevated stress and causes of high stress.  Discuss the correlation between heart/lung disease and anxiety and treatment options. Review healthy ways to manage with stress and anxiety.   Depression: - Provides group verbal and written instruction on the correlation between heart/lung disease and depressed mood, treatment options, and the stigmas associated with seeking treatment.   Anatomy & Physiology of the Heart: - Group verbal and written instruction and models provide basic cardiac anatomy and physiology, with the coronary electrical and arterial systems. Review of Valvular disease and Heart Failure   Cardiac Procedures: - Group verbal and written instruction to review commonly prescribed medications for heart disease. Reviews the medication, class of the drug, and side effects. Includes the steps to properly store meds and maintain the prescription regimen. (beta blockers and nitrates)   Cardiac Medications I: - Group verbal and written instruction to review commonly prescribed medications for heart disease. Reviews the medication, class of the drug, and side effects. Includes the steps to properly store meds and maintain the prescription regimen.   Cardiac Medications II: -Group verbal and written instruction to review commonly prescribed medications for heart disease. Reviews the medication, class of the drug, and side effects. (all other  drug classes)   Cardiac Rehab from 04/21/2018 in [facility] Cardiac and Pulmonary Rehab  Date  04/21/18  Educator  SB  Instruction Review Code  1- Verbalizes Understanding       Go Sex-Intimacy & Heart Disease, Get SMART - Goal Setting: - Group verbal and written instruction through game format to discuss heart disease and the return to sexual intimacy. Provides group verbal and written material to discuss and apply goal setting through the application of the S.M.A.R.T. Method.   Other Matters of the Heart: - Provides group verbal, written materials and models to describe Stable Angina and Peripheral Artery. Includes description of the disease process and treatment options available to the cardiac patient.   Exercise & Equipment Safety: - Individual verbal instruction and demonstration of equipment use and safety with use of the equipment.   Cardiac Rehab from 04/21/2018 in [facility] Cardiac and Pulmonary  Rehab  Date  04/02/18  Educator  [facility]  Instruction Review Code  1- Verbalizes Understanding      Infection Prevention: - Provides verbal and written material to individual with discussion of infection control including proper hand washing and proper equipment cleaning during exercise session.   Cardiac Rehab from 04/21/2018 in [facility] Cardiac and Pulmonary Rehab  Date  04/02/18  Educator  [facility]  Instruction Review Code  1- Verbalizes Understanding      Falls Prevention: - Provides verbal and written material to individual with discussion of falls prevention and safety.   Cardiac Rehab from 04/21/2018 in [facility] Cardiac and Pulmonary Rehab  Date  04/02/18  Educator  [facility]  Instruction Review Code  1- Verbalizes Understanding      Diabetes: - Individual verbal and written instruction to review signs/symptoms of diabetes, desired ranges of glucose level fasting, after meals and with exercise. Acknowledge that pre and post exercise glucose checks will be done for 3 sessions at entry of  program.   Know Your Numbers and Risk Factors: -Group verbal and written instruction about important numbers in your health.  Discussion of what are risk factors and how they play a role in the disease process.  Review of Cholesterol, Blood Pressure, Diabetes, and BMI and the role they play in your overall health.   Cardiac Rehab from 04/21/2018 in [facility] Cardiac and Pulmonary Rehab  Date  04/21/18  Educator  SB  Instruction Review Code  1- Verbalizes Understanding      Sleep Hygiene: -Provides group verbal and written instruction about how sleep can affect your health.  Define sleep hygiene, discuss sleep cycles and impact of sleep habits. Review good sleep hygiene tips.    Other: -Provides group and verbal instruction on various topics (see comments)   Knowledge Questionnaire Score: Knowledge Questionnaire Score - 04/02/18 1505      Knowledge Questionnaire Score   Pre Score  21/26   correct answers reviewed with patient, focus on nutrition, exercise and angina      Core Components/Risk Factors/Patient Goals at Admission: Personal Goals and Risk Factors at Admission - 04/02/18 1504      Core Components/Risk Factors/Patient Goals on Admission    Weight Management  Yes;Weight Maintenance    Intervention  Weight Management: Develop a combined nutrition and exercise program designed to reach desired caloric intake, while maintaining appropriate intake of nutrient and fiber, sodium and fats, and appropriate energy expenditure required for the weight goal.;Weight Management: Provide education and appropriate resources to help participant work on and attain dietary goals.    Admit Weight  125 lb (56.7 kg)    Expected Outcomes  Short Term: Continue to assess and modify interventions until short term weight is achieved;Weight Maintenance: Understanding of the daily nutrition guidelines, which includes 25-35% calories from fat, 7% or less cal from saturated fats, less than 231m cholesterol,  less than 1.5gm of sodium, & 5 or more servings of fruits and vegetables daily;Long Term: Adherence to nutrition and physical activity/exercise program aimed toward attainment of established weight goal;Understanding recommendations for meals to include 15-35% energy as protein, 25-35% energy from fat, 35-60% energy from carbohydrates, less than 2090mof dietary cholesterol, 20-35 gm of total fiber daily;Understanding of distribution of calorie intake throughout the day with the consumption of 4-5 meals/snacks    Lipids  Yes    Intervention  Provide education and support for participant on nutrition & aerobic/resistive exercise along with prescribed medications to achieve LDL <7031mHDL >58m33m  Expected Outcomes  Short Term: Participant states understanding of desired cholesterol values and is compliant with medications prescribed. Participant is following exercise prescription and nutrition guidelines.;Long Term: Cholesterol controlled with medications as prescribed, with individualized exercise RX and with personalized nutrition plan. Value goals: LDL < 5m, HDL > 40 mg.       Core Components/Risk Factors/Patient Goals Review:    Core Components/Risk Factors/Patient Goals at Discharge (Final Review):    ITP Comments: ITP Comments    Row Name 04/02/18 1453 04/22/18 0606         ITP Comments  Med Review completed. Initial ITP created. Diagnosis can be found in Care Everywhere 9/19  30 day review. Continue with ITP unless direccted changes per Medical Director Chart Review. New to program         Comments:

## 2018-04-23 DIAGNOSIS — Z951 Presence of aortocoronary bypass graft: Secondary | ICD-10-CM

## 2018-04-23 NOTE — Progress Notes (Signed)
Daily Session Note  Patient Details  Name: Darlene Barry MRN: [id number] Date of Birth: 11-Jun-1931 Referring Provider:     Cardiac Rehab from 04/02/2018 in [facility] Cardiac and Pulmonary Rehab  Referring Provider  Nehemiah Massed      Encounter Date: 04/23/2018  Check In: Session Check In - 04/23/18 0916      Check-In   Supervising physician immediately available to respond to emergencies  See telemetry face sheet for immediately available ER MD    Location  [facility]-Cardiac & Pulmonary Rehab    Staff Present  Alberteen Sam, MA, RCEP, CCRP, Exercise Physiologist;Teodoro Jeffreys BS, Exercise Physiologist;Leslie Castrejon RN, BSN;Joseph Hood RCP,RRT,BSRT    Medication changes reported      No    Fall or balance concerns reported     No    Warm-up and Cool-down  Performed as group-led Higher education careers adviser Performed  Yes    VAD Patient?  No    PAD/SET Patient?  No      Pain Assessment   Currently in Pain?  No/denies          Social History   Tobacco Use  Smoking Status Never Smoker  Smokeless Tobacco Never Used    Goals Met:  Independence with exercise equipment Exercise tolerated well No report of cardiac concerns or symptoms Strength training completed today  Goals Unmet:  Not Applicable  Comments: Pt able to follow exercise prescription today without complaint.  Will continue to monitor for progression.    Dr. Emily Filbert is Medical Director for [place] and [facility].

## 2018-04-28 DIAGNOSIS — Z951 Presence of aortocoronary bypass graft: Secondary | ICD-10-CM

## 2018-04-28 NOTE — Progress Notes (Signed)
Daily Session Note  Patient Details  Name: Darlene Barry MRN: [id number] Date of Birth: 1931/07/30 Referring Provider:     Cardiac Rehab from 04/02/2018 in [facility] Cardiac and Pulmonary Rehab  Referring Provider  Nehemiah Massed      Encounter Date: 04/28/2018  Check In: Session Check In - 04/28/18 0913      Check-In   Supervising physician immediately available to respond to emergencies  See telemetry face sheet for immediately available ER MD    Location  [facility]-Cardiac & Pulmonary Rehab    Staff Present  Heath Lark, RN, BSN, CCRP;Jessica Hope, MA, RCEP, CCRP, Exercise Physiologist;Jeanna Durrell BS, Exercise Physiologist;Joseph Tessie Fass RCP,RRT,BSRT    Medication changes reported      No    Fall or balance concerns reported     No    Tobacco Cessation  No Change    Warm-up and Cool-down  Performed as group-led instruction    Resistance Training Performed  Yes    VAD Patient?  No          Social History   Tobacco Use  Smoking Status Never Smoker  Smokeless Tobacco Never Used    Goals Met:  Independence with exercise equipment Exercise tolerated well No report of cardiac concerns or symptoms Strength training completed today  Goals Unmet:  Not Applicable  Comments:Reviewed home exercise with pt today.  Pt plans to walk for exercise.  Reviewed THR, pulse, RPE, sign and symptoms, NTG use, and when to call 911 or MD.  Also discussed weather considerations and indoor options.  Pt voiced understanding.  Dr. Emily Filbert is Medical Director for [place] and [facility].

## 2018-04-29 ENCOUNTER — Other Ambulatory Visit: Payer: Medicare Other

## 2018-04-30 DIAGNOSIS — Z951 Presence of aortocoronary bypass graft: Secondary | ICD-10-CM | POA: Diagnosis not present

## 2018-04-30 NOTE — Progress Notes (Signed)
Daily Session Note  Patient Details  Name: Darlene Barry MRN: [id number] Date of Birth: 10/30/1931 Referring Provider:     Cardiac Rehab from 04/02/2018 in [facility] Cardiac and Pulmonary Rehab  Referring Provider  Kow[place]ki      Encounter Date: 04/30/2018  Check In: Session Check In - 04/30/18 0917      Check-In   Supervising physician immediately available to respond to emergencies  See telemetry face sheet for immediately available ER MD    Location  [facility]-Cardiac & Pulmonary Rehab    Staff Present  Carroll Enterkin, RN, BSN;  BS, Exercise Physiologist;Jessica [place], MA, RCEP, CCRP, Exercise Physiologist;Joseph Hood RCP,RRT,BSRT    Medication changes reported      No    Fall or balance concerns reported     No    Tobacco Cessation  No Change    Warm-up and Cool-down  Performed as group-led instruction    Resistance Training Performed  Yes    VAD Patient?  No    PAD/SET Patient?  No      Pain Assessment   Currently in Pain?  No/denies          Social History   Tobacco Use  Smoking Status Never Smoker  Smokeless Tobacco Never Used    Goals Met:  Independence with exercise equipment Exercise tolerated well No report of cardiac concerns or symptoms Strength training completed today  Goals Unmet:  Not Applicable  Comments: Pt able to follow exercise prescription today without complaint.  Will continue to monitor for progression.    Dr. Mark Miller is Medical Director for [facility] and [facility]. 

## 2018-05-05 DIAGNOSIS — Z951 Presence of aortocoronary bypass graft: Secondary | ICD-10-CM

## 2018-05-05 NOTE — Progress Notes (Signed)
Daily Session Note  Patient Details  Name: Darlene Barry MRN: [id number] Date of Birth: January 09, 1932 Referring Provider:     Cardiac Rehab from 04/02/2018 in [facility] Cardiac and Pulmonary Rehab  Referring Provider  Nehemiah Massed      Encounter Date: 05/05/2018  Check In: Session Check In - 05/05/18 0910      Check-In   Supervising physician immediately available to respond to emergencies  See telemetry face sheet for immediately available ER MD    Location  [facility]-Cardiac & Pulmonary Rehab    Staff Present  Darel Hong, RN BSN;Jeanna Durrell BS, Exercise Physiologist;Jessica [place], MA, RCEP, CCRP, Exercise Physiologist    Medication changes reported      No    Fall or balance concerns reported     No    Tobacco Cessation  No Change    Warm-up and Cool-down  Performed as group-led instruction    Resistance Training Performed  Yes    VAD Patient?  No    PAD/SET Patient?  No      Pain Assessment   Currently in Pain?  No/denies          Social History   Tobacco Use  Smoking Status Never Smoker  Smokeless Tobacco Never Used    Goals Met:  Independence with exercise equipment Exercise tolerated well No report of cardiac concerns or symptoms Strength training completed today  Goals Unmet:  Not Applicable  Comments: Pt able to follow exercise prescription today without complaint.  Will continue to monitor for progression.    Dr. Emily Filbert is Medical Director for [place] and [facility].

## 2018-05-07 DIAGNOSIS — Z951 Presence of aortocoronary bypass graft: Secondary | ICD-10-CM

## 2018-05-07 NOTE — Progress Notes (Signed)
Daily Session Note  Patient Details  Name: Darlene Barry MRN: [id number] Date of Birth: 03/31/1932 Referring Provider:     Cardiac Rehab from 04/02/2018 in [facility] Cardiac and Pulmonary Rehab  Referring Provider  Nehemiah Massed      Encounter Date: 05/07/2018  Check In: Session Check In - 05/07/18 0920      Check-In   Supervising physician immediately available to respond to emergencies  See telemetry face sheet for immediately available ER MD    Location  [facility]-Cardiac & Pulmonary Rehab    Staff Present  Gerlene Burdock, RN, BSN;Leslie Castrejon RN, BSN;Jeanna Durrell BS, Exercise Physiologist;Jessica [place], MA, RCEP, CCRP, Exercise Physiologist    Medication changes reported      No    Fall or balance concerns reported     No    Warm-up and Cool-down  Performed as group-led Higher education careers adviser Performed  Yes    VAD Patient?  No    PAD/SET Patient?  No      Pain Assessment   Currently in Pain?  No/denies          Social History   Tobacco Use  Smoking Status Never Smoker  Smokeless Tobacco Never Used    Goals Met:  Independence with exercise equipment Exercise tolerated well No report of cardiac concerns or symptoms Strength training completed today  Goals Unmet:  Not Applicable  Comments: Pt able to follow exercise prescription today without complaint.  Will continue to monitor for progression.    Dr. Emily Filbert is Medical Director for [facility] and [facility].

## 2018-05-19 ENCOUNTER — Encounter: Payer: Self-pay | Admitting: *Deleted

## 2018-05-19 DIAGNOSIS — Z951 Presence of aortocoronary bypass graft: Secondary | ICD-10-CM

## 2018-05-19 NOTE — Progress Notes (Signed)
Cardiac Individual Treatment Plan  Patient Details  Name: Darlene Barry MRN: [id number] Date of Birth: Dec 27, 1931 Referring Provider:     Cardiac Rehab from 04/02/2018 in [facility] Cardiac and Pulmonary Rehab  Referring Provider  Nehemiah Massed      Initial Encounter Date:    Cardiac Rehab from 04/02/2018 in [facility] Cardiac and Pulmonary Rehab  Date  04/02/18      Visit Diagnosis: S/P CABG x 3  Patient's Home Medications on Admission:  Current Outpatient Medications:  .  aspirin EC 81 MG tablet, Take 81 mg by mouth daily., Disp: , Rfl:  .  atorvastatin (LIPITOR) 80 MG tablet, Take 1 tablet (80 mg total) by mouth daily., Disp: 30 tablet, Rfl: 11 .  Calcium-Phosphorus-Vitamin D (CITRACAL +D3 PO), Take 1 tablet by mouth daily., Disp: , Rfl:  .  Cholecalciferol (VITAMIN D) 2000 units CAPS, Take 2,000 Units by mouth daily., Disp: , Rfl:  .  clindamycin (CLEOCIN) 300 MG capsule, Take 600 mg by mouth See admin instructions. Take 600 mg by mouth 1 hour prior to dental appointment, Disp: , Rfl:  .  denosumab (PROLIA) 60 MG/ML SOSY injection, Inject 60 mg into the skin every 6 (six) months., Disp: , Rfl:  .  diphenhydramine-acetaminophen (TYLENOL PM) 25-500 MG TABS tablet, Take 0.5 tablets by mouth at bedtime., Disp: , Rfl:  .  docusate sodium (COLACE) 100 MG capsule, Take 1 capsule (100 mg total) by mouth 2 (two) times daily. (Patient taking differently: Take 100 mg by mouth at bedtime. ), Disp: 10 capsule, Rfl: 0 .  esomeprazole (NEXIUM) 20 MG capsule, Take 40 mg by mouth daily. , Disp: , Rfl:  .  ferrous sulfate 325 (65 FE) MG tablet, Take 1 tablet (325 mg total) by mouth 3 (three) times daily after meals. (Patient not taking: Reported on 04/02/2018), Disp: , Rfl: 3 .  gabapentin (NEURONTIN) 100 MG capsule, Take 100 mg by mouth See admin instructions. Take 100 mg by mouth in the morning and take 200 mg by mouth at bedtime, Disp: , Rfl:  .  HYDROcodone-acetaminophen (NORCO) 7.5-325 MG tablet, Take 1-2  tablets by mouth every 4 (four) hours as needed for moderate pain. (Patient not taking: Reported on 01/07/2018), Disp: 60 tablet, Rfl: 0 .  isosorbide mononitrate (IMDUR) 30 MG 24 hr tablet, Take 30 mg by mouth daily., Disp: , Rfl:  .  methocarbamol (ROBAXIN) 500 MG tablet, Take 1 tablet (500 mg total) by mouth every 6 (six) hours as needed for muscle spasms. (Patient not taking: Reported on 01/07/2018), Disp: 30 tablet, Rfl: 0 .  metoprolol succinate (TOPROL-XL) 25 MG 24 hr tablet, Take 25 mg by mouth daily., Disp: , Rfl:  .  Multiple Vitamins-Minerals (PRESERVISION AREDS 2 PO), Take 1 capsule by mouth 2 (two) times daily., Disp: , Rfl:  .  polyethylene glycol (MIRALAX / GLYCOLAX) packet, Take 17 g by mouth 2 (two) times daily. (Patient not taking: Reported on 01/07/2018), Disp: 14 each, Rfl: 0 .  Sennosides (EX-LAX PO), Take 25 mg by mouth at bedtime. , Disp: , Rfl:  .  sennosides-docusate sodium (SENOKOT-S) 8.6-50 MG tablet, Take 1 tablet by mouth at bedtime. , Disp: , Rfl:   Past Medical History: Past Medical History:  Diagnosis Date  . Arthritis   . Detached retina   . Fibrocystic breast disease   . GERD (gastroesophageal reflux disease)   . Hyperlipidemia   . Macular degeneration   . Osteoporosis     Tobacco Use: Social History  Tobacco Use  Smoking Status Never Smoker  Smokeless Tobacco Never Used    Labs: Recent Review Flowsheet Data    There is no flowsheet data to display.       Exercise Target Goals: Exercise Program Goal: Individual exercise prescription set using results from initial 6 min walk test and THRR while considering  patient's activity barriers and safety.   Exercise Prescription Goal: Initial exercise prescription builds to 30-45 minutes a day of aerobic activity, 2-3 days per week.  Home exercise guidelines will be given to patient during program as part of exercise prescription that the participant will acknowledge.  Activity Barriers & Risk  Stratification: Activity Barriers & Cardiac Risk Stratification - 04/02/18 1510      Activity Barriers & Cardiac Risk Stratification   Activity Barriers  Shortness of Breath    Cardiac Risk Stratification  High       6 Minute Walk: 6 Minute Walk    Row Name 04/02/18 1419         6 Minute Walk   Phase  Initial     Distance  1100 feet     Walk Time  6 minutes     # of Rest Breaks  0     MPH  2.08     METS  2.24     Symptoms  No     Resting HR  77 bpm     Resting BP  140/66     Resting Oxygen Saturation   100 %     Exercise Oxygen Saturation  during 6 min walk  100 %     Max Ex. HR  109 bpm     Max Ex. BP  140/68     2 Minute Post BP  122/70        Oxygen Initial Assessment:   Oxygen Re-Evaluation:   Oxygen Discharge (Final Oxygen Re-Evaluation):   Initial Exercise Prescription: Initial Exercise Prescription - 04/02/18 1400      Date of Initial Exercise RX and Referring Provider   Date  04/02/18    Referring Provider  Nehemiah Massed      Treadmill   MPH  2    Grade  0    Minutes  15    METs  2.5      NuStep   Level  2    SPM  80    Minutes  15    METs  2.2      Biostep-RELP   Level  3    SPM  50    Minutes  15    METs  2      Prescription Details   Frequency (times per week)  2    Duration  Progress to 30 minutes of continuous aerobic without signs/symptoms of physical distress      Intensity   THRR 40-80% of Max Heartrate  100-124    Ratings of Perceived Exertion  11-13    Perceived Dyspnea  0-4      Resistance Training   Training Prescription  Yes    Weight  2 lb    Reps  10-15       Perform Capillary Blood Glucose checks as needed.  Exercise Prescription Changes: Exercise Prescription Changes    Row Name 04/13/18 1600 04/28/18 1600 05/11/18 1600         Response to Exercise   Blood Pressure (Admit)  112/60  120/84  112/60     Blood Pressure (Exercise)  160/74  146/64  138/78     Blood Pressure (Exit)  114/56  124/70  118/72      Heart Rate (Admit)  74 bpm  83 bpm  85 bpm     Heart Rate (Exercise)  117 bpm  101 bpm  98 bpm     Heart Rate (Exit)  102 bpm  78 bpm  81 bpm     Rating of Perceived Exertion (Exercise)  '15  13  13     '$ Symptoms  none  none  none     Comments  second full day of exercise  -  -     Duration  Continue with 30 min of aerobic exercise without signs/symptoms of physical distress.  Continue with 30 min of aerobic exercise without signs/symptoms of physical distress.  Continue with 30 min of aerobic exercise without signs/symptoms of physical distress.     Intensity  THRR unchanged  THRR unchanged  THRR unchanged       Progression   Progression  Continue to progress workloads to maintain intensity without signs/symptoms of physical distress.  Continue to progress workloads to maintain intensity without signs/symptoms of physical distress.  Continue to progress workloads to maintain intensity without signs/symptoms of physical distress.     Average METs  2.31  2.41  2.51       Resistance Training   Training Prescription  Yes  Yes  Yes     Weight  2 lbs  3 lbs  3 lbs     Reps  10-15  10-15  10-15       Interval Training   Interval Training  No  No  No       Treadmill   MPH  '2  2  2     '$ Grade  0  0  0     Minutes  '15  15  15     '$ METs  2.53  2.53  2.53       NuStep   Level  '2  2  2     '$ Minutes  '15  15  15     '$ METs  2.1  2.7  2       Biostep-RELP   Level  '3  4  4     '$ Minutes  '15  15  15     '$ METs  -  2  3       Home Exercise Plan   Plans to continue exercise at  -  Home (comment) walking at mall  Home (comment) walking at mall     Frequency  -  Add 3 additional days to program exercise sessions.  Add 3 additional days to program exercise sessions.     Initial Home Exercises Provided  -  04/28/18  04/28/18        Exercise Comments: Exercise Comments    Row Name 04/07/18 1135 04/28/18 1054         Exercise Comments  First full day of exercise!  Patient was oriented to gym and  equipment including functions, settings, policies, and procedures.  Patient's individual exercise prescription and treatment plan were reviewed.  All starting workloads were established based on the results of the 6 minute walk test done at initial orientation visit.  The plan for exercise progression was also introduced and progression will be customized based on patient's performance and goals.  Reviewed home exercise with pt today.  Pt plans to walk for exercise.  Reviewed THR, pulse,  RPE, sign and symptoms, NTG use, and when to call 911 or MD.  Also discussed weather considerations and indoor options.  Pt voiced understanding.         Exercise Goals and Review: Exercise Goals    Row Name 04/02/18 1418             Exercise Goals   Increase Physical Activity  Yes       Intervention  Provide advice, education, support and counseling about physical activity/exercise needs.;Develop an individualized exercise prescription for aerobic and resistive training based on initial evaluation findings, risk stratification, comorbidities and participant's personal goals.       Expected Outcomes  Short Term: Attend rehab on a regular basis to increase amount of physical activity.;Long Term: Add in home exercise to make exercise part of routine and to increase amount of physical activity.;Long Term: Exercising regularly at least 3-5 days a week.       Increase Strength and Stamina  Yes       Intervention  Provide advice, education, support and counseling about physical activity/exercise needs.;Develop an individualized exercise prescription for aerobic and resistive training based on initial evaluation findings, risk stratification, comorbidities and participant's personal goals.       Expected Outcomes  Short Term: Increase workloads from initial exercise prescription for resistance, speed, and METs.;Short Term: Perform resistance training exercises routinely during rehab and add in resistance training at  home;Long Term: Improve cardiorespiratory fitness, muscular endurance and strength as measured by increased METs and functional capacity (6MWT)       Able to understand and use rate of perceived exertion (RPE) scale  Yes       Intervention  Provide education and explanation on how to use RPE scale       Expected Outcomes  Short Term: Able to use RPE daily in rehab to express subjective intensity level;Long Term:  Able to use RPE to guide intensity level when exercising independently       Able to understand and use Dyspnea scale  Yes       Intervention  Provide education and explanation on how to use Dyspnea scale       Expected Outcomes  Short Term: Able to use Dyspnea scale daily in rehab to express subjective sense of shortness of breath during exertion;Long Term: Able to use Dyspnea scale to guide intensity level when exercising independently       Knowledge and understanding of Target Heart Rate Range (THRR)  Yes       Intervention  Provide education and explanation of THRR including how the numbers were predicted and where they are located for reference       Expected Outcomes  Short Term: Able to state/look up THRR;Short Term: Able to use daily as guideline for intensity in rehab;Long Term: Able to use THRR to govern intensity when exercising independently       Able to check pulse independently  Yes       Intervention  Provide education and demonstration on how to check pulse in carotid and radial arteries.;Review the importance of being able to check your own pulse for safety during independent exercise       Expected Outcomes  Short Term: Able to explain why pulse checking is important during independent exercise;Long Term: Able to check pulse independently and accurately       Understanding of Exercise Prescription  Yes       Intervention  Provide education, explanation, and written materials on patient's  individual exercise prescription       Expected Outcomes  Short Term: Able to explain  program exercise prescription;Long Term: Able to explain home exercise prescription to exercise independently          Exercise Goals Re-Evaluation : Exercise Goals Re-Evaluation    Row Name 04/07/18 1135 04/13/18 1623 04/28/18 1055 05/11/18 1657       Exercise Goal Re-Evaluation   Exercise Goals Review  Increase Physical Activity;Able to understand and use rate of perceived exertion (RPE) scale;Knowledge and understanding of Target Heart Rate Range (THRR);Understanding of Exercise Prescription;Increase Strength and Stamina  Increase Physical Activity;Increase Strength and Stamina;Understanding of Exercise Prescription  Increase Physical Activity;Increase Strength and Stamina;Able to understand and use rate of perceived exertion (RPE) scale;Able to understand and use Dyspnea scale;Knowledge and understanding of Target Heart Rate Range (THRR);Understanding of Exercise Prescription  Increase Physical Activity;Increase Strength and Stamina;Understanding of Exercise Prescription    Comments  First full day of exercise!  Patient was oriented to gym and equipment including functions, settings, policies, and procedures.  Patient's individual exercise prescription and treatment plan were reviewed.  All starting workloads were established based on the results of the 6 minute walk test done at initial orientation visit.  The plan for exercise progression was also introduced and progression will be customized based on patient's performance and goals.  Darlene Barry is off to a good start with rehab.  She has completed two full days of exercise.  We will continue to monitor her progresssion.   Reviewed home exercise with pt today.  Pt plans to walk for exercise.  Reviewed THR, pulse, RPE, sign and symptoms, NTG use, and when to call 911 or MD.  Also discussed weather considerations and indoor options.  Pt voiced understanding.  Darlene Barry has been doing well in rehab.  She is now on level 4 on the BioStep!  We will continue to  monitor her progress.     Expected Outcomes  Reviewed RPE scale, THR and program prescription with pt today.  Pt voiced understanding and was given a copy of goals to take home.   Short: Continue to attend regularly.  Long; Continue to follow program prescription.   Short - add 1 day per week walking at the mall to program session Long - maintain exercise on her own  Short: Increase NuStep workload.  Long: Continue to increase strength and stamina.        Discharge Exercise Prescription (Final Exercise Prescription Changes): Exercise Prescription Changes - 05/11/18 1600      Response to Exercise   Blood Pressure (Admit)  112/60    Blood Pressure (Exercise)  138/78    Blood Pressure (Exit)  118/72    Heart Rate (Admit)  85 bpm    Heart Rate (Exercise)  98 bpm    Heart Rate (Exit)  81 bpm    Rating of Perceived Exertion (Exercise)  13    Symptoms  none    Duration  Continue with 30 min of aerobic exercise without signs/symptoms of physical distress.    Intensity  THRR unchanged      Progression   Progression  Continue to progress workloads to maintain intensity without signs/symptoms of physical distress.    Average METs  2.51      Resistance Training   Training Prescription  Yes    Weight  3 lbs    Reps  10-15      Interval Training   Interval Training  No  Treadmill   MPH  2    Grade  0    Minutes  15    METs  2.53      NuStep   Level  2    Minutes  15    METs  2      Biostep-RELP   Level  4    Minutes  15    METs  3      Home Exercise Plan   Plans to continue exercise at  Home (comment)   walking at mall   Frequency  Add 3 additional days to program exercise sessions.    Initial Home Exercises Provided  04/28/18       Nutrition:  Target Goals: Understanding of nutrition guidelines, daily intake of sodium '1500mg'$ , cholesterol '200mg'$ , calories 30% from fat and 7% or less from saturated fats, daily to have 5 or more servings of fruits and  vegetables.  Biometrics: Pre Biometrics - 04/02/18 1417      Pre Biometrics   Height  5' 4.5" (1.638 m)    Weight  125 lb 1.6 oz (56.7 kg)    Waist Circumference  29 inches    Hip Circumference  37 inches    Waist to Hip Ratio  0.78 %    BMI (Calculated)  21.15        Nutrition Therapy Plan and Nutrition Goals: Nutrition Therapy & Goals - 04/07/18 1121      Nutrition Therapy   Diet  TLC    Drug/Food Interactions  Statins/Certain Fruits    Protein (specify units)  6oz    Fiber  20 grams    Whole Grain Foods  3 servings   does not typically choose whole grains   Saturated Fats  11 max. grams    Fruits and Vegetables  5 servings/day   8 ideal; eats more vegetables than fruits but eats small portions of all foods   Sodium  2000 grams      Personal Nutrition Goals   Nutrition Goal  Prioritize protein at meal times, especially breakfasts / prior to exercising. For example, rather than a biscuit on its own, add a side of eggs    Personal Goal #2  Because you eat small portions and are trying to regain strength, it would be a good idea to establish an eating routine that allows for smaller/ more frequent meals. This will also be helpful for your husband who would likely do best with smaller meals / finger foods    Personal Goal #3  Eat one additional serving of fruit per week    Comments  She is trying to regain strengh post sugery. Her husband has dementia and does not remember to eat / does not like to eat without her which presents as a challenge for her. She feels she eats small portions. They typically eat out at [employer] each morning for breakfast, eat a light lunch such as a sandwich or soup, and eat both at home and out for dinners. She tries to select a location with vegetables when out 2x/wk and may go to [facility] to get chili the other night. At home she cooks a variety of meats, vegetables, and occasionally starches. Snacks: cheese, cereal, fruit, some desserts with husband.  Beverages: [place] lemonade/tea blend, coffee, water with Crystal [employer]. Traditionally she gravitated towards salty foods but is trying not to add salt to foods now.       Intervention Plan   Intervention  Prescribe, educate and  counsel regarding individualized specific dietary modifications aiming towards targeted core components such as weight, hypertension, lipid management, diabetes, heart failure and other comorbidities.    Expected Outcomes  Short Term Goal: Understand basic principles of dietary content, such as calories, fat, sodium, cholesterol and nutrients.;Short Term Goal: A plan has been developed with personal nutrition goals set during dietitian appointment.;Long Term Goal: Adherence to prescribed nutrition plan.       Nutrition Assessments: Nutrition Assessments - 04/02/18 1510      MEDFICTS Scores   Pre Score  38       Nutrition Goals Re-Evaluation: Nutrition Goals Re-Evaluation    [place] Name 04/07/18 1134             Goals   Nutrition Goal  Because you eat small portions and are trying to regain strength, it would be a good idea to establish an eating routine that allows for smaller/ more frequent meals. This will also be helpful for your husband who would likely do best with smaller meals / finger foods       Comment  She eats 2-3 meals / day currently with the occasional snack. Her husband thrives best on routine, which includes his eating schedule. Each of them fill up quickly at meal times and could benefit from smaller / more frequent meals       Expected Outcome  She will eat on a regular/ more frequent schedule and prioritize protein sources. She will try to provide some finger foods for her husband to encourage wt gain         Personal Goal #2 Re-Evaluation   Personal Goal #2  Eat one additional serving of fruit per week         Personal Goal #3 Re-Evaluation   Personal Goal #3  Prioritize protein at meal times, especially breakfasts/ prior to  exercising. For example, rather than a biscuit on its own, add a side of eggs          Nutrition Goals Discharge (Final Nutrition Goals Re-Evaluation): Nutrition Goals Re-Evaluation - 04/07/18 1134      Goals   Nutrition Goal  Because you eat small portions and are trying to regain strength, it would be a good idea to establish an eating routine that allows for smaller/ more frequent meals. This will also be helpful for your husband who would likely do best with smaller meals / finger foods    Comment  She eats 2-3 meals / day currently with the occasional snack. Her husband thrives best on routine, which includes his eating schedule. Each of them fill up quickly at meal times and could benefit from smaller / more frequent meals    Expected Outcome  She will eat on a regular/ more frequent schedule and prioritize protein sources. She will try to provide some finger foods for her husband to encourage wt gain      Personal Goal #2 Re-Evaluation   Personal Goal #2  Eat one additional serving of fruit per week      Personal Goal #3 Re-Evaluation   Personal Goal #3  Prioritize protein at meal times, especially breakfasts/ prior to exercising. For example, rather than a biscuit on its own, add a side of eggs       Psychosocial: Target Goals: Acknowledge presence or absence of significant depression and/or stress, maximize coping skills, provide positive support system. Participant is able to verbalize types and ability to use techniques and skills needed for reducing stress and depression.  Initial Review & Psychosocial Screening: Initial Psych Review & Screening - 04/02/18 1506      Initial Review   Current issues with  Current Stress Concerns    Source of Stress Concerns  Family    Comments  Storm's husband has dementia. He still drives some. She was thankful she had family stay during her post op time to help out. Now it is back to just the two of them which she thinks will help him get  back on track. He sleeps all night which means she can sleep all night. She is a very active 84 year old, loves to volunteer at the [employer] and hopes to get back to her 2 days a week come January.       Family Dynamics   Good Support System?  Yes   spouse, children     Barriers   Psychosocial barriers to participate in program  The patient should benefit from training in stress management and relaxation.      Screening Interventions   Interventions  Encouraged to exercise;Program counselor consult;To provide support and resources with identified psychosocial needs;Provide feedback about the scores to participant    Expected Outcomes  Short Term goal: Utilizing psychosocial counselor, staff and physician to assist with identification of specific Stressors or current issues interfering with healing process. Setting desired goal for each stressor or current issue identified.;Long Term Goal: Stressors or current issues are controlled or eliminated.;Short Term goal: Identification and review with participant of any Quality of Life or Depression concerns found by scoring the questionnaire.;Long Term goal: The participant improves quality of Life and PHQ9 Scores as seen by post scores and/or verbalization of changes       Quality of Life Scores:  Quality of Life - 04/02/18 1509      Quality of Life   Select  Quality of Life      Quality of Life Scores   Health/Function Pre  24.96 %    Socioeconomic Pre  30 %    Psych/Spiritual Pre  26.75 %    Family Pre  28.8 %    GLOBAL Pre  26.75 %      Scores of 19 and below usually indicate a poorer quality of life in these areas.  A difference of  2-3 points is a clinically meaningful difference.  A difference of 2-3 points in the total score of the Quality of Life Index has been associated with significant improvement in overall quality of life, self-image, physical symptoms, and general health in studies assessing change in  quality of life.  PHQ-9: Recent Review Flowsheet Data    Depression screen [facility] 2/9 04/02/2018   Decreased Interest 0   Down, Depressed, Hopeless 0   PHQ - 2 Score 0   Altered sleeping 1   Tired, decreased energy 1   Change in appetite 0   Feeling bad or failure about yourself  0   Trouble concentrating 0   Moving slowly or fidgety/restless 0   Suicidal thoughts 0   PHQ-9 Score 2   Difficult doing work/chores Not difficult at all     Interpretation of Total Score  Total Score Depression Severity:  1-4 = Minimal depression, 5-9 = Mild depression, 10-14 = Moderate depression, 15-19 = Moderately severe depression, 20-27 = Severe depression   Psychosocial Evaluation and Intervention: Psychosocial Evaluation - 04/09/18 1040      Psychosocial Evaluation & Interventions   Interventions  Encouraged to exercise with the  program and follow exercise prescription;Stress management education    Comments  Counselor met with Darlene Barry [facility]) today for initial psychosocial evaluation.  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  She has a strong support system with a spouse of 29 years; (5) adult children between steps and biological; actively involved in her church and the [employer] for over 20 years and good neighbors!Darlene Barry reports sleeping well and has a good appetite.  She denies a history of depression or anxiety or any current symptoms and is typically in a positive mood.  Darlene Barry has stress in her life with her own health and recovery and that of her spouse who is 44 and has Alzheimer's.  Evona has goals for increasing her stamina and strength and wants to get back to her normal activities - including volunteering 8 hours/week.  Staff will follow with her.     Expected Outcomes  Short:  Darlene Barry will exercise for her health and for her stress at this time.  Long:  Darlene Barry will develop a routine of positive self-care to be able to manage her stress and own health needs.    Continue  Psychosocial Services   Follow up required by staff       Psychosocial Re-Evaluation: Psychosocial Re-Evaluation    [place] Name 04/28/18 1033             Psychosocial Re-Evaluation   Current issues with  Current Stress Concerns       Comments  Darlene Barry is feeling better since starting HT.  She is able to do daily house work as good as before "maybe better".  She is not as SOB as she was before surgery.  The stress with her husband is less since she has been home and they are able to get back to regular routine.  She is happy with her progress and doesnt need to see her Dr until March.       Expected Outcomes  Short - continue to exercise for stress management Long - get back to volunteer activties and all activties she enjoys       Interventions  Encouraged to attend Cardiac Rehabilitation for the exercise       Continue Psychosocial Services   Follow up required by staff          Psychosocial Discharge (Final Psychosocial Re-Evaluation): Psychosocial Re-Evaluation - 04/28/18 1033      Psychosocial Re-Evaluation   Current issues with  Current Stress Concerns    Comments  Darlene Barry is feeling better since starting HT.  She is able to do daily house work as good as before "maybe better".  She is not as SOB as she was before surgery.  The stress with her husband is less since she has been home and they are able to get back to regular routine.  She is happy with her progress and doesnt need to see her Dr until March.    Expected Outcomes  Short - continue to exercise for stress management Long - get back to volunteer activties and all activties she enjoys    Interventions  Encouraged to attend Cardiac Rehabilitation for the exercise    Continue Psychosocial Services   Follow up required by staff       Vocational Rehabilitation: Provide vocational rehab assistance to qualifying candidates.   Vocational Rehab Evaluation & Intervention: Vocational Rehab - 04/02/18 1506      Initial Vocational  Rehab Evaluation & Intervention  Assessment shows need for Vocational Rehabilitation  No       Education: Education Goals: Education classes will be provided on a variety of topics geared toward better understanding of heart health and risk factor modification. Participant will state understanding/return demonstration of topics presented as noted by education test scores.  Learning Barriers/Preferences: Learning Barriers/Preferences - 04/02/18 1505      Learning Barriers/Preferences   Learning Barriers  None    Learning Preferences  Individual Instruction;Verbal Instruction       Education Topics:  AED/CPR: - Group verbal and written instruction with the use of models to demonstrate the basic use of the AED with the basic ABC's of resuscitation.   General Nutrition Guidelines/Fats and Fiber: -Group instruction provided by verbal, written material, models and posters to present the general guidelines for heart healthy nutrition. Gives an explanation and review of dietary fats and fiber.   Controlling Sodium/Reading Food Labels: -Group verbal and written material supporting the discussion of sodium use in heart healthy nutrition. Review and explanation with models, verbal and written materials for utilization of the food label.   Exercise Physiology & General Exercise Guidelines: - Group verbal and written instruction with models to review the exercise physiology of the cardiovascular system and associated critical values. Provides general exercise guidelines with specific guidelines to those with heart or lung disease.    Cardiac Rehab from 05/07/2018 in [facility] Cardiac and Pulmonary Rehab  Date  04/07/18  Educator  [facility]  Instruction Review Code  1- Verbalizes Understanding      Aerobic Exercise & Resistance Training: - Gives group verbal and written instruction on the various components of exercise. Focuses on aerobic and resistive training programs and the benefits of this  training and how to safely progress through these programs..   Cardiac Rehab from 05/07/2018 in [facility] Cardiac and Pulmonary Rehab  Date  04/09/18  Educator  AS  Instruction Review Code  1- Verbalizes Understanding      Flexibility, Balance, Mind/Body Relaxation: Provides group verbal/written instruction on the benefits of flexibility and balance training, including mind/body exercise modes such as yoga, pilates and tai chi.  Demonstration and skill practice provided.   Stress and Anxiety: - Provides group verbal and written instruction about the health risks of elevated stress and causes of high stress.  Discuss the correlation between heart/lung disease and anxiety and treatment options. Review healthy ways to manage with stress and anxiety.   Depression: - Provides group verbal and written instruction on the correlation between heart/lung disease and depressed mood, treatment options, and the stigmas associated with seeking treatment.   Cardiac Rehab from 05/07/2018 in [facility] Cardiac and Pulmonary Rehab  Date  04/23/18  Educator  [facility]  Instruction Review Code  1- Verbalizes Understanding      Anatomy & Physiology of the Heart: - Group verbal and written instruction and models provide basic cardiac anatomy and physiology, with the coronary electrical and arterial systems. Review of Valvular disease and Heart Failure   Cardiac Procedures: - Group verbal and written instruction to review commonly prescribed medications for heart disease. Reviews the medication, class of the drug, and side effects. Includes the steps to properly store meds and maintain the prescription regimen. (beta blockers and nitrates)   Cardiac Rehab from 05/07/2018 in [facility] Cardiac and Pulmonary Rehab  Date  04/28/18  Educator  SB  Instruction Review Code  1- Verbalizes Understanding      Cardiac Medications I: - Group verbal and written instruction to review  commonly prescribed medications for heart disease.  Reviews the medication, class of the drug, and side effects. Includes the steps to properly store meds and maintain the prescription regimen.   Cardiac Rehab from 05/07/2018 in [facility] Cardiac and Pulmonary Rehab  Date  05/05/18  Educator  [place]  Instruction Review Code  1- Verbalizes Understanding      Cardiac Medications II: -Group verbal and written instruction to review commonly prescribed medications for heart disease. Reviews the medication, class of the drug, and side effects. (all other drug classes)   Cardiac Rehab from 05/07/2018 in [facility] Cardiac and Pulmonary Rehab  Date  04/21/18  Educator  SB  Instruction Review Code  1- Verbalizes Understanding       Go Sex-Intimacy & Heart Disease, Get SMART - Goal Setting: - Group verbal and written instruction through game format to discuss heart disease and the return to sexual intimacy. Provides group verbal and written material to discuss and apply goal setting through the application of the S.M.A.R.T. Method.   Cardiac Rehab from 05/07/2018 in [facility] Cardiac and Pulmonary Rehab  Date  04/28/18  Educator  SB  Instruction Review Code  1- Verbalizes Understanding      Other Matters of the Heart: - Provides group verbal, written materials and models to describe Stable Angina and Peripheral Artery. Includes description of the disease process and treatment options available to the cardiac patient.   Cardiac Rehab from 05/07/2018 in [facility] Cardiac and Pulmonary Rehab  Date  04/30/18  Educator  CE  Instruction Review Code  1- Verbalizes Understanding      Exercise & Equipment Safety: - Individual verbal instruction and demonstration of equipment use and safety with use of the equipment.   Cardiac Rehab from 05/07/2018 in [facility] Cardiac and Pulmonary Rehab  Date  04/02/18  Educator  [facility]  Instruction Review Code  1- Verbalizes Understanding      Infection Prevention: - Provides verbal and written material to individual with discussion of  infection control including proper hand washing and proper equipment cleaning during exercise session.   Cardiac Rehab from 05/07/2018 in [facility] Cardiac and Pulmonary Rehab  Date  04/02/18  Educator  [facility]  Instruction Review Code  1- Verbalizes Understanding      Falls Prevention: - Provides verbal and written material to individual with discussion of falls prevention and safety.   Cardiac Rehab from 05/07/2018 in [facility] Cardiac and Pulmonary Rehab  Date  04/02/18  Educator  [facility]  Instruction Review Code  1- Verbalizes Understanding      Diabetes: - Individual verbal and written instruction to review signs/symptoms of diabetes, desired ranges of glucose level fasting, after meals and with exercise. Acknowledge that pre and post exercise glucose checks will be done for 3 sessions at entry of program.   Know Your Numbers and Risk Factors: -Group verbal and written instruction about important numbers in your health.  Discussion of what are risk factors and how they play a role in the disease process.  Review of Cholesterol, Blood Pressure, Diabetes, and BMI and the role they play in your overall health.   Cardiac Rehab from 05/07/2018 in [facility] Cardiac and Pulmonary Rehab  Date  04/21/18  Educator  SB  Instruction Review Code  1- Verbalizes Understanding      Sleep Hygiene: -Provides group verbal and written instruction about how sleep can affect your health.  Define sleep hygiene, discuss sleep cycles and impact of sleep habits. Review good sleep hygiene tips.  Other: -Provides group and verbal instruction on various topics (see comments)   Knowledge Questionnaire Score: Knowledge Questionnaire Score - 04/02/18 1505      Knowledge Questionnaire Score   Pre Score  21/26   correct answers reviewed with patient, focus on nutrition, exercise and angina      Core Components/Risk Factors/Patient Goals at Admission: Personal Goals and Risk Factors at Admission - 04/02/18 1504       Core Components/Risk Factors/Patient Goals on Admission    Weight Management  Yes;Weight Maintenance    Intervention  Weight Management: Develop a combined nutrition and exercise program designed to reach desired caloric intake, while maintaining appropriate intake of nutrient and fiber, sodium and fats, and appropriate energy expenditure required for the weight goal.;Weight Management: Provide education and appropriate resources to help participant work on and attain dietary goals.    Admit Weight  125 lb (56.7 kg)    Expected Outcomes  Short Term: Continue to assess and modify interventions until short term weight is achieved;Weight Maintenance: Understanding of the daily nutrition guidelines, which includes 25-35% calories from fat, 7% or less cal from saturated fats, less than '200mg'$  cholesterol, less than 1.5gm of sodium, & 5 or more servings of fruits and vegetables daily;Long Term: Adherence to nutrition and physical activity/exercise program aimed toward attainment of established weight goal;Understanding recommendations for meals to include 15-35% energy as protein, 25-35% energy from fat, 35-60% energy from carbohydrates, less than '200mg'$  of dietary cholesterol, 20-35 gm of total fiber daily;Understanding of distribution of calorie intake throughout the day with the consumption of 4-5 meals/snacks    Lipids  Yes    Intervention  Provide education and support for participant on nutrition & aerobic/resistive exercise along with prescribed medications to achieve LDL '70mg'$ , HDL >'40mg'$ .    Expected Outcomes  Short Term: Participant states understanding of desired cholesterol values and is compliant with medications prescribed. Participant is following exercise prescription and nutrition guidelines.;Long Term: Cholesterol controlled with medications as prescribed, with individualized exercise RX and with personalized nutrition plan. Value goals: LDL < '70mg'$ , HDL > 40 mg.       Core Components/Risk  Factors/Patient Goals Review:  Goals and Risk Factor Review    Row Name 04/28/18 1029             Core Components/Risk Factors/Patient Goals Review   Personal Goals Review  Lipids;Other;Hypertension       Review  Darlene Barry is taking meds sa directed daily.  She thinks she had a prescirption for NTG but may have thrown it out.  She will check on the status of her Rx.  We reviewed correct procedures for taking NTG and the  importance of keeping it with you at all times.       Expected Outcomes  Short - call Dr about NTG Rx Long - continue to take meds as directed          Core Components/Risk Factors/Patient Goals at Discharge (Final Review):  Goals and Risk Factor Review - 04/28/18 1029      Core Components/Risk Factors/Patient Goals Review   Personal Goals Review  Lipids;Other;Hypertension    Review  Darlene Barry is taking meds sa directed daily.  She thinks she had a prescirption for NTG but may have thrown it out.  She will check on the status of her Rx.  We reviewed correct procedures for taking NTG and the  importance of keeping it with you at all times.    Expected Outcomes  Short - call  Dr about NTG Rx Long - continue to take meds as directed       ITP Comments: ITP Comments    Row Name 04/02/18 1453 04/22/18 0606 05/19/18 0633       ITP Comments  Med Review completed. Initial ITP created. Diagnosis can be found in Care Everywhere 9/19  30 day review. Continue with ITP unless direccted changes per Medical Director Chart Review. New to program  30 Day Review. Continue with ITP unless directed changes per Medical Director review        Comments:

## 2018-05-19 NOTE — Progress Notes (Signed)
Daily Session Note  Patient Details  Name: Darlene Barry MRN: [id number] Date of Birth: Mar 08, 1932 Referring Provider:     Cardiac Rehab from 04/02/2018 in [facility] Cardiac and Pulmonary Rehab  Referring Provider  Nehemiah Massed      Encounter Date: 05/19/2018  Check In: Session Check In - 05/19/18 1111      Check-In   Supervising physician immediately available to respond to emergencies  See telemetry face sheet for immediately available ER MD    Location  [facility]-Cardiac & Pulmonary Rehab    Staff Present  Gerlene Burdock, RN, BSN;Nader Boys BS, Exercise Physiologist;Amanda Oletta Darter, BA, ACSM CEP, Exercise Physiologist    Medication changes reported      No    Fall or balance concerns reported     No    Tobacco Cessation  No Change    Warm-up and Cool-down  Performed as group-led instruction    Resistance Training Performed  Yes    VAD Patient?  No    PAD/SET Patient?  No      Pain Assessment   Currently in Pain?  No/denies    Multiple Pain Sites  No          Social History   Tobacco Use  Smoking Status Never Smoker  Smokeless Tobacco Never Used    Goals Met:  Independence with exercise equipment Exercise tolerated well No report of cardiac concerns or symptoms Strength training completed today  Goals Unmet:  Not Applicable  Comments: Pt able to follow exercise prescription today without complaint.  Will continue to monitor for progression.    Dr. Emily Filbert is Medical Director for [place] and [facility].

## 2018-05-21 ENCOUNTER — Encounter: Payer: Medicare Other | Attending: Internal Medicine

## 2018-05-21 DIAGNOSIS — Z951 Presence of aortocoronary bypass graft: Secondary | ICD-10-CM | POA: Insufficient documentation

## 2018-05-21 NOTE — Progress Notes (Signed)
Daily Session Note  Patient Details  Name: Darlene Barry MRN: [id number] Date of Birth: [birth year] Referring Provider:     Cardiac Rehab from 04/02/2018 in [facility] Cardiac and Pulmonary Rehab  Referring Provider  Nehemiah Massed      Encounter Date: 05/21/2018  Check In: Session Check In - 05/21/18 0919      Check-In   Supervising physician immediately available to respond to emergencies  See telemetry face sheet for immediately available ER MD    Location  [facility]-Cardiac & Pulmonary Rehab    Staff Present  Justin Mend RCP,RRT,BSRT;Jeanna Durrell BS, Exercise Physiologist;Carroll Enterkin, RN, BSN    Medication changes reported      No    Fall or balance concerns reported     No    Warm-up and Cool-down  Performed as group-led instruction    Resistance Training Performed  Yes    VAD Patient?  No    PAD/SET Patient?  No      Pain Assessment   Currently in Pain?  No/denies          Social History   Tobacco Use  Smoking Status Never Smoker  Smokeless Tobacco Never Used    Goals Met:  Independence with exercise equipment Exercise tolerated well No report of cardiac concerns or symptoms Strength training completed today  Goals Unmet:  Not Applicable  Comments: Pt able to follow exercise prescription today without complaint.  Will continue to monitor for progression.    Dr. Emily Filbert is Medical Director for [place] and [facility].

## 2018-05-26 DIAGNOSIS — Z951 Presence of aortocoronary bypass graft: Secondary | ICD-10-CM

## 2018-05-26 NOTE — Progress Notes (Signed)
Daily Session Note  Patient Details  Name: Darlene Barry MRN: [id number] Date of Birth: 02/26/1932 Referring Provider:     Cardiac Rehab from 04/02/2018 in [facility] Cardiac and Pulmonary Rehab  Referring Provider  Nehemiah Massed      Encounter Date: 05/26/2018  Check In: Session Check In - 05/26/18 2774      Check-In   Supervising physician immediately available to respond to emergencies  See telemetry face sheet for immediately available ER MD    Location  [facility]-Cardiac & Pulmonary Rehab    Staff Present  Heath Lark, RN, BSN, CCRP;Jeanna Durrell BS, Exercise Physiologist;Aliyah Abeyta [place], MA, RCEP, CCRP, Exercise Physiologist;Amanda Oletta Darter, BA, ACSM CEP, Exercise Physiologist    Medication changes reported      No    Fall or balance concerns reported     No    Warm-up and Cool-down  Performed as group-led instruction    Resistance Training Performed  Yes    VAD Patient?  No    PAD/SET Patient?  No      Pain Assessment   Currently in Pain?  No/denies          Social History   Tobacco Use  Smoking Status Never Smoker  Smokeless Tobacco Never Used    Goals Met:  Independence with exercise equipment Exercise tolerated well No report of cardiac concerns or symptoms Strength training completed today  Goals Unmet:  Not Applicable  Comments: Pt able to follow exercise prescription today without complaint.  Will continue to monitor for progression.    Dr. Emily Filbert is Medical Director for [place] and [facility].

## 2018-05-28 DIAGNOSIS — Z951 Presence of aortocoronary bypass graft: Secondary | ICD-10-CM | POA: Diagnosis not present

## 2018-05-28 NOTE — Progress Notes (Signed)
Daily Session Note  Patient Details  Name: Darlene Barry MRN: [id number] Date of Birth: [birth year] Referring Provider:     Cardiac Rehab from 04/02/2018 in [facility] Cardiac and Pulmonary Rehab  Referring Provider  Nehemiah Massed      Encounter Date: 05/28/2018  Check In: Session Check In - 05/28/18 0914      Check-In   Supervising physician immediately available to respond to emergencies  See telemetry face sheet for immediately available ER MD    Location  [facility]-Cardiac & Pulmonary Rehab    Staff Present  Gerlene Burdock, RN, BSN;Jeanna Durrell BS, Exercise Physiologist;Jessica [place], MA, RCEP, CCRP, Exercise Physiologist    Medication changes reported      No    Fall or balance concerns reported     No    Tobacco Cessation  No Change    Warm-up and Cool-down  Performed as group-led instruction    Resistance Training Performed  Yes    VAD Patient?  No    PAD/SET Patient?  No      Pain Assessment   Currently in Pain?  No/denies    Multiple Pain Sites  No          Social History   Tobacco Use  Smoking Status Never Smoker  Smokeless Tobacco Never Used    Goals Met:  Independence with exercise equipment Exercise tolerated well Personal goals reviewed No report of cardiac concerns or symptoms Strength training completed today  Goals Unmet:  Not Applicable  Comments: Pt able to follow exercise prescription today without complaint.  Will continue to monitor for progression.    Dr. Emily Filbert is Medical Director for [place] and [facility].

## 2018-06-02 ENCOUNTER — Encounter: Payer: Medicare Other | Admitting: *Deleted

## 2018-06-02 DIAGNOSIS — Z951 Presence of aortocoronary bypass graft: Secondary | ICD-10-CM

## 2018-06-02 NOTE — Progress Notes (Signed)
Daily Session Note  Patient Details  Name: Darlene Barry MRN: [id number] Date of Birth: 06/04/1931 Referring Provider:     Cardiac Rehab from 04/02/2018 in [facility] Cardiac and Pulmonary Rehab  Referring Provider  Darlene Barry      Encounter Date: 06/02/2018  Check In: Session Check In - 06/02/18 0920      Check-In   Supervising physician immediately available to respond to emergencies  See telemetry face sheet for immediately available ER MD    Location  [facility]-Cardiac & Pulmonary Rehab    Staff Present  Heath Lark, RN, BSN, CCRP;Mickeal Daws [place], MA, RCEP, CCRP, Exercise Physiologist;Joseph [employer], [place], ACSM CEP, Exercise Physiologist    Medication changes reported      No    Fall or balance concerns reported     No    Warm-up and Cool-down  Performed as group-led instruction    Resistance Training Performed  Yes    VAD Patient?  No    PAD/SET Patient?  No      Pain Assessment   Currently in Pain?  No/denies          Social History   Tobacco Use  Smoking Status Never Smoker  Smokeless Tobacco Never Used    Goals Met:  Independence with exercise equipment Exercise tolerated well No report of cardiac concerns or symptoms Strength training completed today  Goals Unmet:  Not Applicable  Comments: Pt able to follow exercise prescription today without complaint.  Will continue to monitor for progression.    Dr. Emily Filbert is Medical Director for [name] and [facility].

## 2018-06-04 ENCOUNTER — Encounter: Payer: Medicare Other | Admitting: *Deleted

## 2018-06-04 DIAGNOSIS — Z951 Presence of aortocoronary bypass graft: Secondary | ICD-10-CM

## 2018-06-04 NOTE — Progress Notes (Signed)
Daily Session Note  Patient Details  Name: Darlene Barry MRN: [id number] Date of Birth: 1931-11-10 Referring Provider:     Cardiac Rehab from 04/02/2018 in [facility] Cardiac and Pulmonary Rehab  Referring Provider  Nehemiah Massed      Encounter Date: 06/04/2018  Check In: Session Check In - 06/04/18 0916      Check-In   Supervising physician immediately available to respond to emergencies  See telemetry face sheet for immediately available ER MD    Location  [facility]-Cardiac & Pulmonary Rehab    Staff Present  Vida Rigger RN, BSN;Jeanna Durrell BS, Exercise Physiologist;Ranae Casebier Luan [place], MA, RCEP, CCRP, Exercise Physiologist;Joseph Tessie Fass RCP,RRT,BSRT    Medication changes reported      No    Fall or balance concerns reported     No    Warm-up and Cool-down  Performed as group-led instruction    Resistance Training Performed  Yes    VAD Patient?  No    PAD/SET Patient?  No      Pain Assessment   Currently in Pain?  No/denies          Social History   Tobacco Use  Smoking Status Never Smoker  Smokeless Tobacco Never Used    Goals Met:  Independence with exercise equipment Exercise tolerated well No report of cardiac concerns or symptoms Strength training completed today  Goals Unmet:  Not Applicable  Comments: Pt able to follow exercise prescription today without complaint.  Will continue to monitor for progression.    Dr. Emily Filbert is Medical Director for [place] and [facility].

## 2018-06-09 DIAGNOSIS — Z951 Presence of aortocoronary bypass graft: Secondary | ICD-10-CM | POA: Diagnosis not present

## 2018-06-09 NOTE — Progress Notes (Signed)
Daily Session Note  Patient Details  Name: Darlene Barry MRN: [id number] Date of Birth: 10-23-1931 Referring Provider:     Cardiac Rehab from 04/02/2018 in [facility] Cardiac [facility] Pul[facility] Rehab  Referring Provider  Nehemiah Massed      Encounter Date: 06/09/2018  Check In: Session Check In - 06/09/18 0933      Check-In   Supervising physician immediately available to respond to emergencies  See telemetry face sheet for immediately available ER MD    Location  [facility]-Cardiac & Pulmonary Rehab    Staff Present  Heath Lark, RN, BSN, CCRP;Cheronda Erck BS, Exercise Physiologist;Jessica [place], [place], RCEP, CCRP, Exercise Physiologist    Medication changes reported      No    Fall or balance concerns reported     No    Tobacco Cessation  No Change    Warm-up and Cool-down  Performed as group-led instruction    Resistance Training Performed  Yes    VAD Patient?  No    PAD/SET Patient?  No      Pain Assessment   Currently in Pain?  No/denies    Multiple Pain Sites  No          Social History   Tobacco Use  Smoking Status Never Smoker  Smokeless Tobacco Never Used    Goals Met:  Independence with exercise equipment Exercise tolerated well No report of cardiac concerns or symptoms Strength training completed today  Goals Unmet:  Not Applicable  Comments: Pt able to follow exercise prescription today without complaint.  Will continue to monitor for progression.    Dr. Emily Filbert is Medical Director for [place] and [facility].

## 2018-06-11 DIAGNOSIS — Z951 Presence of aortocoronary bypass graft: Secondary | ICD-10-CM | POA: Diagnosis not present

## 2018-06-11 NOTE — Progress Notes (Signed)
Daily Session Note  Patient Details  Name: Darlene Barry MRN: [id number] Date of Birth: Jul 25, 1931 Referring Provider:     Cardiac Rehab from 04/02/2018 in [facility] Cardiac [facility] Rehab  Referring Provider  Nehemiah Massed      Encounter Date: 06/11/2018  Check In: Session Check In - 06/11/18 0926      Check-In   Supervising physician immediately available to respond to emergencies  See telemetry face sheet for immediately available ER MD    Location  [facility]-Cardiac & Pulmonary Rehab    Staff Present  Gerlene Burdock, RN, BSN;Jessica Luan Pulling, MA, RCEP, CCRP, Exercise Physiologist;Bonnetta Allbee Tessie Fass RCP,RRT,BSRT    Medication changes reported      No    Fall or balance concerns reported     No    Warm-up and Cool-down  Performed as group-led instruction    Resistance Training Performed  Yes    VAD Patient?  No    PAD/SET Patient?  No      Pain Assessment   Currently in Pain?  No/denies          Social History   Tobacco Use  Smoking Status Never Smoker  Smokeless Tobacco Never Used    Goals Met:  Independence with exercise equipment Exercise tolerated well No report of cardiac concerns or symptoms Strength training completed today  Goals Unmet:  Not Applicable  Comments: Pt able to follow exercise prescription today without complaint.  Will continue to monitor for progression.    Dr. Emily Filbert is Medical Director for [place] and [facility].

## 2018-06-16 ENCOUNTER — Encounter: Payer: Medicare Other | Admitting: *Deleted

## 2018-06-16 VITALS — Ht 64.5 in | Wt 123.0 lb

## 2018-06-16 DIAGNOSIS — Z951 Presence of aortocoronary bypass graft: Secondary | ICD-10-CM | POA: Diagnosis not present

## 2018-06-16 NOTE — Progress Notes (Signed)
Daily Session Note  Patient Details  Name: Darlene Barry MRN: [id number] Date of Birth: 09/06/1931 Referring Provider:     Cardiac Rehab from 04/02/2018 in [facility] Cardiac and Pulmonary Rehab  Referring Provider  [place]      Encounter Date: 06/16/2018  Check In: Session Check In - 06/16/18 0927      Check-In   Supervising physician immediately available to respond to emergencies  See telemetry face sheet for immediately available ER MD    Location  [facility]-Cardiac & Pulmonary Rehab    Staff Present  Jeanna Durrell BS, Exercise Physiologist; , MA, RCEP, CCRP, Exercise Physiologist;Amanda Sommer, BA, ACSM CEP, Exercise Physiologist;Krista Spencer, RN BSN    Medication changes reported      No    Fall or balance concerns reported     No    Warm-up and Cool-down  Performed as group-led instruction    Resistance Training Performed  Yes    VAD Patient?  No    PAD/SET Patient?  No      Pain Assessment   Currently in Pain?  No/denies          Social History   Tobacco Use  Smoking Status Never Smoker  Smokeless Tobacco Never Used    Goals Met:  Independence with exercise equipment Exercise tolerated well No report of cardiac concerns or symptoms Strength training completed today  Goals Unmet:  Not Applicable  Comments: Pt able to follow exercise prescription today without complaint.  Will continue to monitor for progression.  6 Minute Walk    Row Name 04/02/18 1419 06/16/18 1200       6 Minute Walk   Phase  Initial  Discharge    Distance  1100 feet  1365 feet    Distance % Change  -  24.1 %    Distance Feet Change  -  265 ft    Walk Time  6 minutes  6 minutes    # of Rest Breaks  0  0    MPH  2.08  2.59    METS  2.24  2.55    RPE  -  13    Perceived Dyspnea   -  1    VO2 Peak  -  8.92    Symptoms  No  Yes (comment)    Comments  -  SOB    Resting HR  77 bpm  84 bpm    Resting BP  140/66  110/64    Resting Oxygen Saturation   100 %  -    Exercise  Oxygen Saturation  during 6 min walk  100 %  92 %    Max Ex. HR  109 bpm  101 bpm    Max Ex. BP  140/68  128/70    2 Minute Post BP  122/70  -        Dr. Mark Miller is Medical Director for [facility] and [facility]. 

## 2018-06-17 DIAGNOSIS — Z951 Presence of aortocoronary bypass graft: Secondary | ICD-10-CM

## 2018-06-17 NOTE — Progress Notes (Signed)
Cardiac Individual Treatment Plan  Patient Details  Name: Darlene Barry MRN: [id number] Date of Birth: [birth year] Referring Provider:     Cardiac Rehab from 11/83/2019 in [facility] Cardiac and Pulmonary Rehab  Referring Provider  Nehemiah Massed      Initial Encounter Date:    Cardiac Rehab from 11/83/2019 in [facility] Cardiac and Pulmonary Rehab  Date  04/02/18      Visit Diagnosis: S/P CABG x 3  Patient's Home Medications on Admission:  Current Outpatient Medications:  .  aspirin EC 81 MG tablet, Take 81 mg by mouth daily., Disp: , Rfl:  .  atorvastatin (LIPITOR) 80 MG tablet, Take 1 tablet (80 mg total) by mouth daily., Disp: 30 tablet, Rfl: 11 .  Calcium-Phosphorus-Vitamin D (CITRACAL +D3 PO), Take 1 tablet by mouth daily., Disp: , Rfl:  .  Cholecalciferol (VITAMIN D) 2000 units CAPS, Take 2,000 Units by mouth daily., Disp: , Rfl:  .  clindamycin (CLEOCIN) 300 MG capsule, Take 600 mg by mouth See admin instructions. Take 600 mg by mouth 1 hour prior to dental appointment, Disp: , Rfl:  .  denosumab (PROLIA) 60 MG/ML SOSY injection, Inject 60 mg into the skin every 6 (six) months., Disp: , Rfl:  .  diphenhydramine-acetaminophen (TYLENOL PM) 25-500 MG TABS tablet, Take 0.5 tablets by mouth at bedtime., Disp: , Rfl:  .  docusate sodium (COLACE) 100 MG capsule, Take 1 capsule (100 mg total) by mouth 2 (two) times daily. (Patient taking differently: Take 100 mg by mouth at bedtime. ), Disp: 10 capsule, Rfl: 0 .  esomeprazole (NEXIUM) 20 MG capsule, Take 40 mg by mouth daily. , Disp: , Rfl:  .  ferrous sulfate 325 (65 FE) MG tablet, Take 1 tablet (325 mg total) by mouth 3 (three) times daily after meals. (Patient not taking: Reported on 04/02/2018), Disp: , Rfl: 3 .  gabapentin (NEURONTIN) 100 MG capsule, Take 100 mg by mouth See admin instructions. Take 100 mg by mouth in the morning and take 200 mg by mouth at bedtime, Disp: , Rfl:  .  HYDROcodone-acetaminophen (NORCO) 7.5-325 MG tablet, Take 1-2  tablets by mouth every 4 (four) hours as needed for moderate pain. (Patient not taking: Reported on 01/07/2018), Disp: 60 tablet, Rfl: 0 .  isosorbide mononitrate (IMDUR) 30 MG 24 hr tablet, Take 30 mg by mouth daily., Disp: , Rfl:  .  methocarbamol (ROBAXIN) 500 MG tablet, Take 1 tablet (500 mg total) by mouth every 6 (six) hours as needed for muscle spasms. (Patient not taking: Reported on 01/07/2018), Disp: 30 tablet, Rfl: 0 .  metoprolol succinate (TOPROL-XL) 25 MG 24 hr tablet, Take 25 mg by mouth daily., Disp: , Rfl:  .  Multiple Vitamins-Minerals (PRESERVISION AREDS 2 PO), Take 1 capsule by mouth 2 (two) times daily., Disp: , Rfl:  .  polyethylene glycol (MIRALAX / GLYCOLAX) packet, Take 17 g by mouth 2 (two) times daily. (Patient not taking: Reported on 01/07/2018), Disp: 14 each, Rfl: 0 .  Sennosides (EX-LAX PO), Take 25 mg by mouth at bedtime. , Disp: , Rfl:  .  sennosides-docusate sodium (SENOKOT-S) 8.6-50 MG tablet, Take 1 tablet by mouth at bedtime. , Disp: , Rfl:   Past Medical History: Past Medical History:  Diagnosis Date  . Arthritis   . Detached retina   . Fibrocystic breast disease   . GERD (gastroesophageal reflux disease)   . Hyperlipidemia   . Macular degeneration   . Osteoporosis     Tobacco Use: Social History  Tobacco Use  Smoking Status Never Smoker  Smokeless Tobacco Never Used    Labs: Recent Review Flowsheet Data    There is no flowsheet data to display.       Exercise Target Goals: Exercise Program Goal: Individual exercise prescription set using results from initial 6 min walk test and THRR while considering  patient's activity barriers and safety.   Exercise Prescription Goal: Initial exercise prescription builds to 30-45 minutes a day of aerobic activity, 2-3 days per week.  Home exercise guidelines will be given to patient during program as part of exercise prescription that the participant will acknowledge.  Activity Barriers & Risk  Stratification: Activity Barriers & Cardiac Risk Stratification - 04/02/18 1510      Activity Barriers & Cardiac Risk Stratification   Activity Barriers  Shortness of Breath    Cardiac Risk Stratification  High       6 Minute Walk: 6 Minute Walk    Row Name 04/02/18 1419 06/16/18 1200       6 Minute Walk   Phase  Initial  Discharge    Distance  1100 feet  1365 feet    Distance % Change  -  24.1 %    Distance Feet Change  -  265 ft    Walk Time  6 minutes  6 minutes    # of Rest Breaks  0  0    MPH  2.08  2.59    METS  2.24  2.55    RPE  -  13    Perceived Dyspnea   -  1    VO2 Peak  -  8.92    Symptoms  No  Yes (comment)    Comments  -  SOB    Resting HR  77 bpm  84 bpm    Resting BP  140/66  110/64    Resting Oxygen Saturation   100 %  -    Exercise Oxygen Saturation  during 6 min walk  100 %  92 %    Max Ex. HR  109 bpm  101 bpm    Max Ex. BP  140/68  128/70    2 Minute Post BP  122/70  -       Oxygen Initial Assessment:   Oxygen Re-Evaluation:   Oxygen Discharge (Final Oxygen Re-Evaluation):   Initial Exercise Prescription: Initial Exercise Prescription - 04/02/18 1400      Date of Initial Exercise RX and Referring Provider   Date  04/02/18    Referring Provider  Nehemiah Massed      Treadmill   MPH  2    Grade  0    Minutes  15    METs  2.5      NuStep   Level  2    SPM  80    Minutes  15    METs  2.2      Biostep-RELP   Level  3    SPM  50    Minutes  15    METs  2      Prescription Details   Frequency (times per week)  2    Duration  Progress to 30 minutes of continuous aerobic without signs/symptoms of physical distress      Intensity   THRR 40-80% of Max Heartrate  100-124    Ratings of Perceived Exertion  11-13    Perceived Dyspnea  0-4      Resistance Training   Training Prescription  Yes    Weight  2 lb    Reps  10-15       Perform Capillary Blood Glucose checks as needed.  Exercise Prescription Changes: Exercise  Prescription Changes    Row Name 04/13/18 1600 04/28/18 1600 05/11/18 1600 05/26/18 1500 06/09/18 1400     Response to Exercise   Blood Pressure (Admit)  112/60  120/84  112/60  124/62  132/70   Blood Pressure (Exercise)  160/74  146/64  138/78  128/84  114/60   Blood Pressure (Exit)  114/56  124/70  118/72  124/64  100/64   Heart Rate (Admit)  74 bpm  83 bpm  85 bpm  93 bpm  98 bpm   Heart Rate (Exercise)  117 bpm  101 bpm  98 bpm  109 bpm  112 bpm   Heart Rate (Exit)  102 bpm  78 bpm  81 bpm  92 bpm  95 bpm   Rating of Perceived Exertion (Exercise)  '15  13  13  15  12   '$ Symptoms  none  none  none  none  none   Comments  second full day of exercise  -  -  -  -   Duration  Continue with 30 min of aerobic exercise without signs/symptoms of physical distress.  Continue with 30 min of aerobic exercise without signs/symptoms of physical distress.  Continue with 30 min of aerobic exercise without signs/symptoms of physical distress.  Continue with 30 min of aerobic exercise without signs/symptoms of physical distress.  Continue with 30 min of aerobic exercise without signs/symptoms of physical distress.   Intensity  THRR unchanged  THRR unchanged  THRR unchanged  THRR unchanged  THRR unchanged     Progression   Progression  Continue to progress workloads to maintain intensity without signs/symptoms of physical distress.  Continue to progress workloads to maintain intensity without signs/symptoms of physical distress.  Continue to progress workloads to maintain intensity without signs/symptoms of physical distress.  Continue to progress workloads to maintain intensity without signs/symptoms of physical distress.  Continue to progress workloads to maintain intensity without signs/symptoms of physical distress.   Average METs  2.31  2.41  2.51  2.22  2.79     Resistance Training   Training Prescription  Yes  Yes  Yes  Yes  Yes   Weight  2 lbs  3 lbs  3 lbs  3 lbs  3 lbs   Reps  10-15  10-15  10-15   10-15  10-15     Interval Training   Interval Training  No  No  No  No  No     Treadmill   MPH  '2  2  2  2  2   '$ Grade  0  0  0  0.5  0.5   Minutes  '15  15  15  15  15   '$ METs  2.53  2.53  2.53  2.67  2.67     NuStep   Level  '2  2  2  2  4   '$ Minutes  '15  15  15  15  15   '$ METs  2.1  2.'7  2  2  '$ 2.7     Biostep-RELP   Level  '3  4  4  4  4   '$ Minutes  '15  15  15  15  15   '$ METs  -  '2  3  2  '$ 3  Home Exercise Plan   Plans to continue exercise at  -  Home (comment) walking at [place] (comment) walking at [place] (comment) walking at [place] (comment) walking at mall   Frequency  -  Add 3 additional days to program exercise sessions.  Add 3 additional days to program exercise sessions.  Add 3 additional days to program exercise sessions.  Add 3 additional days to program exercise sessions.   Initial Home Exercises Provided  -  04/28/18  04/28/18  04/28/18  04/28/18      Exercise Comments: Exercise Comments    Row Name 04/07/18 1135 04/28/18 1054         Exercise Comments  First full day of exercise!  Patient was oriented to gym and equipment including functions, settings, policies, and procedures.  Patient's individual exercise prescription and treatment plan were reviewed.  All starting workloads were established based on the results of the 6 minute walk test done at initial orientation visit.  The plan for exercise progression was also introduced and progression will be customized based on patient's performance and goals.  Reviewed home exercise with pt today.  Pt plans to walk for exercise.  Reviewed THR, pulse, RPE, sign and symptoms, NTG use, and when to call 911 or MD.  Also discussed weather considerations and indoor options.  Pt voiced understanding.         Exercise Goals and Review: Exercise Goals    Row Name 04/02/18 1418             Exercise Goals   Increase Physical Activity  Yes       Intervention  Provide advice, education, support and counseling about  physical activity/exercise needs.;Develop an individualized exercise prescription for aerobic and resistive training based on initial evaluation findings, risk stratification, comorbidities and participant's personal goals.       Expected Outcomes  Short Term: Attend rehab on a regular basis to increase amount of physical activity.;Long Term: Add in home exercise to make exercise part of routine and to increase amount of physical activity.;Long Term: Exercising regularly at least 3-5 days a week.       Increase Strength and Stamina  Yes       Intervention  Provide advice, education, support and counseling about physical activity/exercise needs.;Develop an individualized exercise prescription for aerobic and resistive training based on initial evaluation findings, risk stratification, comorbidities and participant's personal goals.       Expected Outcomes  Short Term: Increase workloads from initial exercise prescription for resistance, speed, and METs.;Short Term: Perform resistance training exercises routinely during rehab and add in resistance training at home;Long Term: Improve cardiorespiratory fitness, muscular endurance and strength as measured by increased METs and functional capacity (6MWT)       Able to understand and use rate of perceived exertion (RPE) scale  Yes       Intervention  Provide education and explanation on how to use RPE scale       Expected Outcomes  Short Term: Able to use RPE daily in rehab to express subjective intensity level;Long Term:  Able to use RPE to guide intensity level when exercising independently       Able to understand and use Dyspnea scale  Yes       Intervention  Provide education and explanation on how to use Dyspnea scale       Expected Outcomes  Short Term: Able to use Dyspnea scale daily in rehab to express subjective  sense of shortness of breath during exertion;Long Term: Able to use Dyspnea scale to guide intensity level when exercising independently        Knowledge and understanding of Target Heart Rate Range (THRR)  Yes       Intervention  Provide education and explanation of THRR including how the numbers were predicted and where they are located for reference       Expected Outcomes  Short Term: Able to state/look up THRR;Short Term: Able to use daily as guideline for intensity in rehab;Long Term: Able to use THRR to govern intensity when exercising independently       Able to check pulse independently  Yes       Intervention  Provide education and demonstration on how to check pulse in carotid and radial arteries.;Review the importance of being able to check your own pulse for safety during independent exercise       Expected Outcomes  Short Term: Able to explain why pulse checking is important during independent exercise;Long Term: Able to check pulse independently and accurately       Understanding of Exercise Prescription  Yes       Intervention  Provide education, explanation, and written materials on patient's individual exercise prescription       Expected Outcomes  Short Term: Able to explain program exercise prescription;Long Term: Able to explain home exercise prescription to exercise independently          Exercise Goals Re-Evaluation : Exercise Goals Re-Evaluation    Row Name 04/07/18 1135 04/13/18 1623 04/28/18 1055 05/11/18 1657 05/26/18 1502     Exercise Goal Re-Evaluation   Exercise Goals Review  Increase Physical Activity;Able to understand and use rate of perceived exertion (RPE) scale;Knowledge and understanding of Target Heart Rate Range (THRR);Understanding of Exercise Prescription;Increase Strength and Stamina  Increase Physical Activity;Increase Strength and Stamina;Understanding of Exercise Prescription  Increase Physical Activity;Increase Strength and Stamina;Able to understand and use rate of perceived exertion (RPE) scale;Able to understand and use Dyspnea scale;Knowledge and understanding of Target Heart Rate Range  (THRR);Understanding of Exercise Prescription  Increase Physical Activity;Increase Strength and Stamina;Understanding of Exercise Prescription  Increase Physical Activity;Increase Strength and Stamina;Understanding of Exercise Prescription   Comments  First full day of exercise!  Patient was oriented to gym and equipment including functions, settings, policies, and procedures.  Patient's individual exercise prescription and treatment plan were reviewed.  All starting workloads were established based on the results of the 6 minute walk test done at initial orientation visit.  The plan for exercise progression was also introduced and progression will be customized based on patient's performance and goals.  Adonis is off to a good start with rehab.  She has completed two full days of exercise.  We will continue to monitor her progresssion.   Reviewed home exercise with pt today.  Pt plans to walk for exercise.  Reviewed THR, pulse, RPE, sign and symptoms, NTG use, and when to call 911 or MD.  Also discussed weather considerations and indoor options.  Pt voiced understanding.  Kyilee has been doing well in rehab.  She is now on level 4 on the BioStep!  We will continue to monitor her progress.   Jackelin continues to do well in rehab.  She has added incline to her treadmill.  We still need to increase her NuStep.  We will continue to monitor her progress.    Expected Outcomes  Reviewed RPE scale, THR and program prescription with pt today.  Pt voiced  understanding and was given a copy of goals to take home.   Short: Continue to attend regularly.  Long; Continue to follow program prescription.   Short - add 1 day per week walking at the mall to program session Long - maintain exercise on her own  Short: Increase NuStep workload.  Long: Continue to increase strength and stamina.   Short: Increase NuStep.  Long: Continue to exercise more at home.    [place] Name 06/09/18 1430             Exercise Goal Re-Evaluation    Exercise Goals Review  Increase Physical Activity;Increase Strength and Stamina;Understanding of Exercise Prescription       Comments  Annalysia has been doing well in rehab. She is up to level 4 the NuStep.  We will continue to monitor her progress.        Expected Outcomes  Short: Continue to move up workloads.  Long: Continue to exercise more at home.           Discharge Exercise Prescription (Final Exercise Prescription Changes): Exercise Prescription Changes - 06/09/18 1400      Response to Exercise   Blood Pressure (Admit)  132/70    Blood Pressure (Exercise)  114/60    Blood Pressure (Exit)  100/64    Heart Rate (Admit)  98 bpm    Heart Rate (Exercise)  112 bpm    Heart Rate (Exit)  95 bpm    Rating of Perceived Exertion (Exercise)  12    Symptoms  none    Duration  Continue with 30 min of aerobic exercise without signs/symptoms of physical distress.    Intensity  THRR unchanged      Progression   Progression  Continue to progress workloads to maintain intensity without signs/symptoms of physical distress.    Average METs  2.79      Resistance Training   Training Prescription  Yes    Weight  3 lbs    Reps  10-15      Interval Training   Interval Training  No      Treadmill   MPH  2    Grade  0.5    Minutes  15    METs  2.67      NuStep   Level  4    Minutes  15    METs  2.7      Biostep-RELP   Level  4    Minutes  15    METs  3      Home Exercise Plan   Plans to continue exercise at  Home (comment)   walking at mall   Frequency  Add 3 additional days to program exercise sessions.    Initial Home Exercises Provided  04/28/18       Nutrition:  Target Goals: Understanding of nutrition guidelines, daily intake of sodium '1500mg'$ , cholesterol '200mg'$ , calories 30% from fat and 7% or less from saturated fats, daily to have 5 or more servings of fruits and vegetables.  Biometrics: Pre Biometrics - 04/02/18 1417      Pre Biometrics   Height  5' 4.5" (1.638 m)     Weight  125 lb 1.6 oz (56.7 kg)    Waist Circumference  29 inches    Hip Circumference  37 inches    Waist to Hip Ratio  0.78 %    BMI (Calculated)  21.15      Post Biometrics - 06/16/18 1201  Post  Biometrics   Height  5' 4.5" (1.638 m)    Weight  123 lb (55.8 kg)    Waist Circumference  28 inches    Hip Circumference  37 inches    Waist to Hip Ratio  0.76 %    BMI (Calculated)  20.79    Single Leg Stand  1.6 seconds       Nutrition Therapy Plan and Nutrition Goals: Nutrition Therapy & Goals - 04/07/18 1121      Nutrition Therapy   Diet  TLC    Drug/Food Interactions  Statins/Certain Fruits    Protein (specify units)  6oz    Fiber  20 grams    Whole Grain Foods  3 servings   does not typically choose whole grains   Saturated Fats  11 max. grams    Fruits and Vegetables  5 servings/day   8 ideal; eats more vegetables than fruits but eats small portions of all foods   Sodium  2000 grams      Personal Nutrition Goals   Nutrition Goal  Prioritize protein at meal times, especially breakfasts / prior to exercising. For example, rather than a biscuit on its own, add a side of eggs    Personal Goal #2  Because you eat small portions and are trying to regain strength, it would be a good idea to establish an eating routine that allows for smaller/ more frequent meals. This will also be helpful for your husband who would likely do best with smaller meals / finger foods    Personal Goal #3  Eat one additional serving of fruit per week    Comments  She is trying to regain strengh post sugery. Her husband has dementia and does not remember to eat / does not like to eat without her which presents as a challenge for her. She feels she eats small portions. They typically eat out at [place] each morning for breakfast, eat a light lunch such as a sandwich or soup, and eat both at home and out for dinners. She tries to select a location with vegetables when out 2x/wk and may go to  [facility] to get chili the other night. At home she cooks a variety of meats, vegetables, and occasionally starches. Snacks: cheese, cereal, fruit, some desserts with husband. Beverages: [place] lemonade/tea blend, coffee, water with Crystal Light. Traditionally she gravitated towards salty foods but is trying not to add salt to foods now.       Intervention Plan   Intervention  Prescribe, educate and counsel regarding individualized specific dietary modifications aiming towards targeted core components such as weight, hypertension, lipid management, diabetes, heart failure and other comorbidities.    Expected Outcomes  Short Term Goal: Understand basic principles of dietary content, such as calories, fat, sodium, cholesterol and nutrients.;Short Term Goal: A plan has been developed with personal nutrition goals set during dietitian appointment.;Long Term Goal: Adherence to prescribed nutrition plan.       Nutrition Assessments: Nutrition Assessments - 04/02/18 1510      MEDFICTS Scores   Pre Score  38       Nutrition Goals Re-Evaluation: Nutrition Goals Re-Evaluation    [place] Name 04/07/18 1134 05/19/18 1013           Goals   Nutrition Goal  Because you eat small portions and are trying to regain strength, it would be a good idea to establish an eating routine that allows for smaller/ more frequent meals. This will also  be helpful for your husband who would likely do best with smaller meals / finger foods  Establish a regular eating routine that allows for smaller/ more frequent meals to help improve energy d/t early satiety at meal times; Eat one additional serving of fruit per week; Prioritize protein at meal times      Comment  She eats 2-3 meals / day currently with the occasional snack. Her husband thrives best on routine, which includes his eating schedule. Each of them fill up quickly at meal times and could benefit from smaller / more frequent meals  She feels that she has been  eating well recently. She is eating different protein sources but has not yet added more fruits into her diet. Recently she and her husband have been ordering child-sized meals when eating out, stating that this serving size is more appropriate for their appetites. She has also been drinking more water d/t being "on the edge of dehydration" during her last physical. Sometimes she adds Crystal Light flavoring to encourage herself to drink more      Expected Outcome  She will eat on a regular/ more frequent schedule and prioritize protein sources. She will try to provide some finger foods for her husband to encourage wt gain  Continue to increase fluid intake and to prioritize protein intake daily. Long term goal: eat more fruit and eat more frequently to prevent wt loss        Personal Goal #2 Re-Evaluation   Personal Goal #2  Eat one additional serving of fruit per week  -        Personal Goal #3 Re-Evaluation   Personal Goal #3  Prioritize protein at meal times, especially breakfasts/ prior to exercising. For example, rather than a biscuit on its own, add a side of eggs  -         Nutrition Goals Discharge (Final Nutrition Goals Re-Evaluation): Nutrition Goals Re-Evaluation - 05/19/18 1013      Goals   Nutrition Goal  Establish a regular eating routine that allows for smaller/ more frequent meals to help improve energy d/t early satiety at meal times; Eat one additional serving of fruit per week; Prioritize protein at meal times    Comment  She feels that she has been eating well recently. She is eating different protein sources but has not yet added more fruits into her diet. Recently she and her husband have been ordering child-sized meals when eating out, stating that this serving size is more appropriate for their appetites. She has also been drinking more water d/t being "on the edge of dehydration" during her last physical. Sometimes she adds Crystal Light flavoring to encourage herself to  drink more    Expected Outcome  Continue to increase fluid intake and to prioritize protein intake daily. Long term goal: eat more fruit and eat more frequently to prevent wt loss       Psychosocial: Target Goals: Acknowledge presence or absence of significant depression and/or stress, maximize coping skills, provide positive support system. Participant is able to verbalize types and ability to use techniques and skills needed for reducing stress and depression.   Initial Review & Psychosocial Screening: Initial Psych Review & Screening - 04/02/18 1506      Initial Review   Current issues with  Current Stress Concerns    Source of Stress Concerns  Family    Comments  Brooksie's husband has dementia. He still drives some. She was thankful she had family stay during  her post op time to help out. Now it is back to just the two of them which she thinks will help him get back on track. He sleeps all night which means she can sleep all night. She is a very active 85 year old, loves to volunteer at the [employer] and hopes to get back to her 2 days a week come January.       Family Dynamics   Good Support System?  Yes   spouse, children     Barriers   Psychosocial barriers to participate in program  The patient should benefit from training in stress management and relaxation.      Screening Interventions   Interventions  Encouraged to exercise;Program counselor consult;To provide support and resources with identified psychosocial needs;Provide feedback about the scores to participant    Expected Outcomes  Short Term goal: Utilizing psychosocial counselor, staff and physician to assist with identification of specific Stressors or current issues interfering with healing process. Setting desired goal for each stressor or current issue identified.;Long Term Goal: Stressors or current issues are controlled or eliminated.;Short Term goal: Identification and review with participant  of any Quality of Life or Depression concerns found by scoring the questionnaire.;Long Term goal: The participant improves quality of Life and PHQ9 Scores as seen by post scores and/or verbalization of changes       Quality of Life Scores:  Quality of Life - 04/02/18 1509      Quality of Life   Select  Quality of Life      Quality of Life Scores   Health/Function Pre  24.96 %    Socioeconomic Pre  30 %    Psych/Spiritual Pre  26.75 %    Family Pre  28.8 %    GLOBAL Pre  26.75 %      Scores of 19 and below usually indicate a poorer quality of life in these areas.  A difference of  2-3 points is a clinically meaningful difference.  A difference of 2-3 points in the total score of the Quality of Life Index has been associated with significant improvement in overall quality of life, self-image, physical symptoms, and general health in studies assessing change in quality of life.  PHQ-9: Recent Review Flowsheet Data    Depression screen [facility] 2/9 04/02/2018   Decreased Interest 0   Down, Depressed, Hopeless 0   PHQ - 2 Score 0   Altered sleeping 1   Tired, decreased energy 1   Change in appetite 0   Feeling bad or failure about yourself  0   Trouble concentrating 0   Moving slowly or fidgety/restless 0   Suicidal thoughts 0   PHQ-9 Score 2   Difficult doing work/chores Not difficult at all     Interpretation of Total Score  Total Score Depression Severity:  1-4 = Minimal depression, 5-9 = Mild depression, 10-14 = Moderate depression, 15-19 = Moderately severe depression, 20-27 = Severe depression   Psychosocial Evaluation and Intervention: Psychosocial Evaluation - 04/09/18 1040      Psychosocial Evaluation & Interventions   Interventions  Encouraged to exercise with the program and follow exercise prescription;Stress management education    Comments  Counselor met with [facility]. [facility]) today for initial psychosocial evaluation.  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  She has a strong support system with a spouse of 27 years; (5) adult children between steps and biological; actively involved in  her church and the [employer] for over 20 years and good neighbors!Stanton Kidney reports sleeping well and has a good appetite.  She denies a history of depression or anxiety or any current symptoms and is typically in a positive mood.  Anasia has stress in her life with her own health and recovery and that of her spouse who is 35 and has Alzheimer's.  Lyssa has goals for increasing her stamina and strength and wants to get back to her normal activities - including volunteering 8 hours/week.  Staff will follow with her.     Expected Outcomes  Short:  Isabellarose will exercise for her health and for her stress at this time.  Long:  Amonda will develop a routine of positive self-care to be able to manage her stress and own health needs.    Continue Psychosocial Services   Follow up required by staff       Psychosocial Re-Evaluation: Psychosocial Re-Evaluation    Fifty-Six Name 04/28/18 1033 05/28/18 1018           Psychosocial Re-Evaluation   Current issues with  Current Stress Concerns  Current Stress Concerns      Comments  Leylanie is feeling better since starting HT.  She is able to do daily house work as good as before "maybe better".  She is not as SOB as she was before surgery.  The stress with her husband is less since she has been home and they are able to get back to regular routine.  She is happy with her progress and doesnt need to see her Dr until March.  Makell has some stress related to taking care of things like car oil changes and getting her and her spouse to Dr appointments.  She doesnt want to commit to a regular exercise program due to their schedule - her volunteer work and Dr appointments.      Expected Outcomes  Short - continue to exercise for stress management Long - get back to volunteer activties and all activties she enjoys  Short - develop a plan to  continue exercise and manage home needs  when she finishes HT Long - maintain independence with ADLs      Interventions  Encouraged to attend Cardiac Rehabilitation for the exercise  Encouraged to attend Cardiac Rehabilitation for the exercise      Continue Psychosocial Services   Follow up required by staff  Follow up required by staff         Psychosocial Discharge (Final Psychosocial Re-Evaluation): Psychosocial Re-Evaluation - 05/28/18 1018      Psychosocial Re-Evaluation   Current issues with  Current Stress Concerns    Comments  Luna has some stress related to taking care of things like car oil changes and getting her and her spouse to Dr appointments.  She doesnt want to commit to a regular exercise program due to their schedule - her volunteer work and Dr appointments.    Expected Outcomes  Short - develop a plan to continue exercise and manage home needs  when she finishes HT Long - maintain independence with ADLs    Interventions  Encouraged to attend Cardiac Rehabilitation for the exercise    Continue Psychosocial Services   Follow up required by staff       Vocational Rehabilitation: Provide vocational rehab assistance to qualifying candidates.   Vocational Rehab Evaluation & Intervention: Vocational Rehab - 04/02/18 1506      Initial Vocational Rehab Evaluation & Intervention  Assessment shows need for Vocational Rehabilitation  No       Education: Education Goals: Education classes will be provided on a variety of topics geared toward better understanding of heart health and risk factor modification. Participant will state understanding/return demonstration of topics presented as noted by education test scores.  Learning Barriers/Preferences: Learning Barriers/Preferences - 04/02/18 1505      Learning Barriers/Preferences   Learning Barriers  None    Learning Preferences  Individual Instruction;Verbal Instruction       Education Topics:  AED/CPR: - Group  verbal and written instruction with the use of models to demonstrate the basic use of the AED with the basic ABC's of resuscitation.   Cardiac Rehab from 06/16/2018 in [facility] Cardiac and Pulmonary Rehab  Date  05/19/18  Educator  CE  Instruction Review Code  1- Verbalizes Understanding      General Nutrition Guidelines/Fats and Fiber: -Group instruction provided by verbal, written material, models and posters to present the general guidelines for heart healthy nutrition. Gives an explanation and review of dietary fats and fiber.   Cardiac Rehab from 06/16/2018 in [facility] Cardiac and Pulmonary Rehab  Date  06/02/18  Educator  LB  Instruction Review Code  1- Verbalizes Understanding      Controlling Sodium/Reading Food Labels: -Group verbal and written material supporting the discussion of sodium use in heart healthy nutrition. Review and explanation with models, verbal and written materials for utilization of the food label.   Cardiac Rehab from 06/16/2018 in [facility] Cardiac and Pulmonary Rehab  Date  06/04/18  Educator  LB  Instruction Review Code  1- Verbalizes Understanding      Exercise Physiology & General Exercise Guidelines: - Group verbal and written instruction with models to review the exercise physiology of the cardiovascular system and associated critical values. Provides general exercise guidelines with specific guidelines to those with heart or lung disease.    Cardiac Rehab from 06/16/2018 in [facility] Cardiac and Pulmonary Rehab  Date  06/09/18  Educator  [facility]  Instruction Review Code  1- Verbalizes Understanding      Aerobic Exercise & Resistance Training: - Gives group verbal and written instruction on the various components of exercise. Focuses on aerobic and resistive training programs and the benefits of this training and how to safely progress through these programs..   Cardiac Rehab from 06/16/2018 in [facility] Cardiac and Pulmonary Rehab  Date  06/11/18  Educator  [place]   Instruction Review Code  1- Verbalizes Understanding      Flexibility, Balance, Mind/Body Relaxation: Provides group verbal/written instruction on the benefits of flexibility and balance training, including mind/body exercise modes such as yoga, pilates and tai chi.  Demonstration and skill practice provided.   Cardiac Rehab from 06/16/2018 in [facility] Cardiac and Pulmonary Rehab  Date  06/16/18  Educator  AS  Instruction Review Code  1- Verbalizes Understanding      Stress and Anxiety: - Provides group verbal and written instruction about the health risks of elevated stress and causes of high stress.  Discuss the correlation between heart/lung disease and anxiety and treatment options. Review healthy ways to manage with stress and anxiety.   Depression: - Provides group verbal and written instruction on the correlation between heart/lung disease and depressed mood, treatment options, and the stigmas associated with seeking treatment.   Cardiac Rehab from 06/16/2018 in [facility] Cardiac and Pulmonary Rehab  Date  05/26/18  Educator  [facility]  Instruction Review Code  1- Verbalizes Understanding  Anatomy & Physiology of the Heart: - Group verbal and written instruction and models provide basic cardiac anatomy and physiology, with the coronary electrical and arterial systems. Review of Valvular disease and Heart Failure   Cardiac Procedures: - Group verbal and written instruction to review commonly prescribed medications for heart disease. Reviews the medication, class of the drug, and side effects. Includes the steps to properly store meds and maintain the prescription regimen. (beta blockers and nitrates)   Cardiac Rehab from 06/16/2018 in [facility] Cardiac and Pulmonary Rehab  Date  04/28/18  Educator  SB  Instruction Review Code  1- Verbalizes Understanding      Cardiac Medications I: - Group verbal and written instruction to review commonly prescribed medications for heart disease. Reviews  the medication, class of the drug, and side effects. Includes the steps to properly store meds and maintain the prescription regimen.   Cardiac Rehab from 06/16/2018 in [facility] Cardiac and Pulmonary Rehab  Date  05/05/18  Educator  KS  Instruction Review Code  1- Verbalizes Understanding      Cardiac Medications II: -Group verbal and written instruction to review commonly prescribed medications for heart disease. Reviews the medication, class of the drug, and side effects. (all other drug classes)   Cardiac Rehab from 06/16/2018 in [facility] Cardiac and Pulmonary Rehab  Date  04/21/18  Educator  SB  Instruction Review Code  1- Verbalizes Understanding       Go Sex-Intimacy & Heart Disease, Get SMART - Goal Setting: - Group verbal and written instruction through game format to discuss heart disease and the return to sexual intimacy. Provides group verbal and written material to discuss and apply goal setting through the application of the S.M.A.R.T. Method.   Cardiac Rehab from 06/16/2018 in [facility] Cardiac and Pulmonary Rehab  Date  04/28/18  Educator  SB  Instruction Review Code  1- Verbalizes Understanding      Other Matters of the Heart: - Provides group verbal, written materials and models to describe Stable Angina and Peripheral Artery. Includes description of the disease process and treatment options available to the cardiac patient.   Cardiac Rehab from 06/16/2018 in [facility] Cardiac and Pulmonary Rehab  Date  04/30/18  Educator  CE  Instruction Review Code  1- Verbalizes Understanding      Exercise & Equipment Safety: - Individual verbal instruction and demonstration of equipment use and safety with use of the equipment.   Cardiac Rehab from 06/16/2018 in [facility] Cardiac and Pulmonary Rehab  Date  04/02/18  Educator  [facility]  Instruction Review Code  1- Verbalizes Understanding      Infection Prevention: - Provides verbal and written material to individual with discussion of infection  control including proper hand washing and proper equipment cleaning during exercise session.   Cardiac Rehab from 06/16/2018 in [facility] Cardiac and Pulmonary Rehab  Date  04/02/18  Educator  [facility]  Instruction Review Code  1- Verbalizes Understanding      Falls Prevention: - Provides verbal and written material to individual with discussion of falls prevention and safety.   Cardiac Rehab from 06/16/2018 in [facility] Cardiac and Pulmonary Rehab  Date  04/02/18  Educator  [facility]  Instruction Review Code  1- Verbalizes Understanding      Diabetes: - Individual verbal and written instruction to review signs/symptoms of diabetes, desired ranges of glucose level fasting, after meals and with exercise. Acknowledge that pre and post exercise glucose checks will be done for 3 sessions at entry of program.  Know Your Numbers and Risk Factors: -Group verbal and written instruction about important numbers in your health.  Discussion of what are risk factors and how they play a role in the disease process.  Review of Cholesterol, Blood Pressure, Diabetes, and BMI and the role they play in your overall health.   Cardiac Rehab from 06/16/2018 in [facility] Cardiac and Pulmonary Rehab  Date  04/21/18  Educator  SB  Instruction Review Code  1- Verbalizes Understanding      Sleep Hygiene: -Provides group verbal and written instruction about how sleep can affect your health.  Define sleep hygiene, discuss sleep cycles and impact of sleep habits. Review good sleep hygiene tips.    Other: -Provides group and verbal instruction on various topics (see comments)   Knowledge Questionnaire Score: Knowledge Questionnaire Score - 04/02/18 1505      Knowledge Questionnaire Score   Pre Score  21/26   correct answers reviewed with patient, focus on nutrition, exercise and angina      Core Components/Risk Factors/Patient Goals at Admission: Personal Goals and Risk Factors at Admission - 04/02/18 1504      Core  Components/Risk Factors/Patient Goals on Admission    Weight Management  Yes;Weight Maintenance    Intervention  Weight Management: Develop a combined nutrition and exercise program designed to reach desired caloric intake, while maintaining appropriate intake of nutrient and fiber, sodium and fats, and appropriate energy expenditure required for the weight goal.;Weight Management: Provide education and appropriate resources to help participant work on and attain dietary goals.    Admit Weight  125 lb (56.7 kg)    Expected Outcomes  Short Term: Continue to assess and modify interventions until short term weight is achieved;Weight Maintenance: Understanding of the daily nutrition guidelines, which includes 25-35% calories from fat, 7% or less cal from saturated fats, less than '200mg'$  cholesterol, less than 1.5gm of sodium, & 5 or more servings of fruits and vegetables daily;Long Term: Adherence to nutrition and physical activity/exercise program aimed toward attainment of established weight goal;Understanding recommendations for meals to include 15-35% energy as protein, 25-35% energy from fat, 35-60% energy from carbohydrates, less than '200mg'$  of dietary cholesterol, 20-35 gm of total fiber daily;Understanding of distribution of calorie intake throughout the day with the consumption of 4-5 meals/snacks    Lipids  Yes    Intervention  Provide education and support for participant on nutrition & aerobic/resistive exercise along with prescribed medications to achieve LDL '70mg'$ , HDL >'40mg'$ .    Expected Outcomes  Short Term: Participant states understanding of desired cholesterol values and is compliant with medications prescribed. Participant is following exercise prescription and nutrition guidelines.;Long Term: Cholesterol controlled with medications as prescribed, with individualized exercise RX and with personalized nutrition plan. Value goals: LDL < '70mg'$ , HDL > 40 mg.       Core Components/Risk  Factors/Patient Goals Review:  Goals and Risk Factor Review    Row Name 04/28/18 1029 05/28/18 1008           Core Components/Risk Factors/Patient Goals Review   Personal Goals Review  Lipids;Other;Hypertension  Hypertension;Lipids;Other      Review  Laityn is taking meds sa directed daily.  She thinks she had a prescirption for NTG but may have thrown it out.  She will check on the status of her Rx.  We reviewed correct procedures for taking NTG and the  importance of keeping it with you at all times.  Taking meds as directed.  She does have NTG but did  not check expiration date.  She will check when she gets home.  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  She is able to do all her ADL's at home if she rests between.   Phylicia is not exercising outside HT.  We discussed options.         Expected Outcomes  Short - call Dr about NTG Rx Long - continue to take meds as directed  Short - check expiration on meds Long - have a plan to continue exercise when she finishes HT         Core Components/Risk Factors/Patient Goals at Discharge (Final Review):  Goals and Risk Factor Review - 05/28/18 1008      Core Components/Risk Factors/Patient Goals Review   Personal Goals Review  Hypertension;Lipids;Other    Review  Taking meds as directed.  She does have NTG but did not check expiration date.  She will check when she gets home.  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  She is able to do all her ADL's at home if she rests between.   Diania is not exercising outside HT.  We discussed options.       Expected Outcomes  Short - check expiration on meds Long - have a plan to continue exercise when she finishes HT       ITP Comments: ITP Comments    Row Name 04/02/18 1453 04/22/18 0606 05/19/18 5638 06/17/18 0947     ITP Comments  Med Review completed. Initial ITP created. Diagnosis can be found in Care Everywhere 9/19  30 day review. Continue with ITP unless direccted changes per Medical  Director Chart Review. New to program  30 Day Review. Continue with ITP unless directed changes per Medical Director review  30 Day Review. Continue with ITP unless directed changes per Medical Director review       Comments: 30 day review

## 2018-06-18 ENCOUNTER — Encounter: Payer: Medicare Other | Admitting: *Deleted

## 2018-06-18 DIAGNOSIS — Z951 Presence of aortocoronary bypass graft: Secondary | ICD-10-CM | POA: Diagnosis not present

## 2018-06-18 NOTE — Progress Notes (Signed)
Daily Session Note  Patient Details  Name: Darlene Barry MRN: [id number] Date of Birth: [birth year] Referring Provider:     Cardiac Rehab from 04/02/2018 in [facility] Cardiac and Pulmonary Rehab  Referring Provider  Nehemiah Massed      Encounter Date: 06/18/2018  Check In: Session Check In - 06/18/18 0917      Check-In   Supervising physician immediately available to respond to emergencies  See telemetry face sheet for immediately available ER MD    Location  [facility]-Cardiac & Pulmonary Rehab    Staff Present  Jasper Loser BS, Exercise Physiologist;Carroll Enterkin, RN, BSN;Oluwatomisin Hustead Luan Pulling, MA, RCEP, CCRP, Exercise Physiologist    Medication changes reported      No    Fall or balance concerns reported     No    Warm-up and Cool-down  Performed as group-led instruction    Resistance Training Performed  Yes    VAD Patient?  No    PAD/SET Patient?  No      Pain Assessment   Currently in Pain?  No/denies          Social History   Tobacco Use  Smoking Status Never Smoker  Smokeless Tobacco Never Used    Goals Met:  Independence with exercise equipment Exercise tolerated well No report of cardiac concerns or symptoms Strength training completed today  Goals Unmet:  Not Applicable  Comments: Pt able to follow exercise prescription today without complaint.  Will continue to monitor for progression.  Darlene Barry graduated today from  rehab with 36 sessions completed.  Details of the patient's exercise prescription and what She needs to do in order to continue the prescription and progress were discussed with patient.  Patient was given a copy of prescription and goals.  Patient verbalized understanding.  Darlene Barry plans to continue to exercise by walking at home and volunteering again.    Dr. Emily Filbert is Medical Director for [facility].

## 2018-06-18 NOTE — Progress Notes (Signed)
Discharge Progress Report  Patient Details  Name: Darlene Barry MRN: [id number] Date of Birth: [birth year] Referring Provider:     Cardiac Rehab from 04/02/2018 in [facility]  Referring Provider  Nehemiah Massed       Number of Visits: 18  Reason for Discharge:  Patient reached a stable level of exercise. Patient independent in their exercise. Patient has met program and personal goals.  Smoking History:  Social History   Tobacco Use  Smoking Status Never Smoker  Smokeless Tobacco Never Used    Diagnosis:  S/P CABG x 3  ADL UCSD:   Initial Exercise Prescription: Initial Exercise Prescription - 04/02/18 1400      Date of Initial Exercise RX and Referring Provider   Date  04/02/18    Referring Provider  Nehemiah Massed      Treadmill   MPH  2    Grade  0    Minutes  15    METs  2.5      NuStep   Level  2    SPM  80    Minutes  15    METs  2.2      Biostep-RELP   Level  3    SPM  50    Minutes  15    METs  2      Prescription Details   Frequency (times per week)  2    Duration  Progress to 30 minutes of continuous aerobic without signs/symptoms of physical distress      Intensity   THRR 40-80% of Max Heartrate  100-124    Ratings of Perceived Exertion  11-13    Perceived Dyspnea  0-4      Resistance Training   Training Prescription  Yes    Weight  2 lb    Reps  10-15       Discharge Exercise Prescription (Final Exercise Prescription Changes): Exercise Prescription Changes - 06/09/18 1400      Response to Exercise   Blood Pressure (Admit)  132/70    Blood Pressure (Exercise)  114/60    Blood Pressure (Exit)  100/64    Heart Rate (Admit)  98 bpm    Heart Rate (Exercise)  112 bpm    Heart Rate (Exit)  95 bpm    Rating of Perceived Exertion (Exercise)  12    Symptoms  none    Duration  Continue with 30 min of aerobic exercise without signs/symptoms of physical distress.    Intensity  THRR unchanged      Progression   Progression   Continue to progress workloads to maintain intensity without signs/symptoms of physical distress.    Average METs  2.79      Resistance Training   Training Prescription  Yes    Weight  3 lbs    Reps  10-15      Interval Training   Interval Training  No      Treadmill   MPH  2    Grade  0.5    Minutes  15    METs  2.67      NuStep   Level  4    Minutes  15    METs  2.7      Biostep-RELP   Level  4    Minutes  15    METs  3      Home Exercise Plan   Plans to continue exercise at  Home (comment)   walking at mall  Frequency  Add 3 additional days to program exercise sessions.    Initial Home Exercises Provided  04/28/18       Functional Capacity: 6 Minute Walk    Row Name 04/02/18 1419 06/16/18 1200       6 Minute Walk   Phase  Initial  Discharge    Distance  1100 feet  1365 feet    Distance % Change  -  24.1 %    Distance Feet Change  -  265 ft    Walk Time  6 minutes  6 minutes    # of Rest Breaks  0  0    MPH  2.08  2.59    METS  2.24  2.55    RPE  -  13    Perceived Dyspnea   -  1    VO2 Peak  -  8.92    Symptoms  No  Yes (comment)    Comments  -  SOB    Resting HR  77 bpm  84 bpm    Resting BP  140/66  110/64    Resting Oxygen Saturation   100 %  -    Exercise Oxygen Saturation  during 6 min walk  100 %  92 %    Max Ex. HR  109 bpm  101 bpm    Max Ex. BP  140/68  128/70    2 Minute Post BP  122/70  -       Psychological, QOL, Others - Outcomes: PHQ 2/9: Depression screen [facility] 2/9 06/18/2018 04/02/2018  Decreased Interest 0 0  Down, Depressed, Hopeless 0 0  PHQ - 2 Score 0 0  Altered sleeping 0 1  Tired, decreased energy 1 1  Change in appetite 0 0  Feeling bad or failure about yourself  0 0  Trouble concentrating 0 0  Moving slowly or fidgety/restless 0 0  Suicidal thoughts 0 0  PHQ-9 Score 1 2  Difficult doing work/chores Not difficult at all Not difficult at all    Quality of Life: Quality of Life - 06/18/18 1309      Quality of  Life Scores   Health/Function Pre  24.96 %    Health/Function Post  25.7 %    Health/Function % Change  2.96 %    Socioeconomic Pre  30 %    Socioeconomic Post  29.58 %    Socioeconomic % Change   -1.4 %    Psych/Spiritual Pre  26.75 %    Psych/Spiritual Post  25.56 %    Psych/Spiritual % Change  -4.45 %    Family Pre  28.8 %    Family Post  28.8 %    Family % Change  0 %    GLOBAL Pre  26.75 %    GLOBAL Post  26.8 %    GLOBAL % Change  0.19 %       Personal Goals: Goals established at orientation with interventions provided to work toward goal. Personal Goals and Risk Factors at Admission - 04/02/18 1504      Core Components/Risk Factors/Patient Goals on Admission    Weight Management  Yes;Weight Maintenance    Intervention  Weight Management: Develop a combined nutrition and exercise program designed to reach desired caloric intake, while maintaining appropriate intake of nutrient and fiber, sodium and fats, and appropriate energy expenditure required for the weight goal.;Weight Management: Provide education and appropriate resources to help participant work on and attain dietary goals.  Admit Weight  125 lb (56.7 kg)    Expected Outcomes  Short Term: Continue to assess and modify interventions until short term weight is achieved;Weight Maintenance: Understanding of the daily nutrition guidelines, which includes 25-35% calories from fat, 7% or less cal from saturated fats, less than 226m cholesterol, less than 1.5gm of sodium, & 5 or more servings of fruits and vegetables daily;Long Term: Adherence to nutrition and physical activity/exercise program aimed toward attainment of established weight goal;Understanding recommendations for meals to include 15-35% energy as protein, 25-35% energy from fat, 35-60% energy from carbohydrates, less than 2046mof dietary cholesterol, 20-35 gm of total fiber daily;Understanding of distribution of calorie intake throughout the day with the  consumption of 4-5 meals/snacks    Lipids  Yes    Intervention  Provide education and support for participant on nutrition & aerobic/resistive exercise along with prescribed medications to achieve LDL <7017mHDL >66m57m  Expected Outcomes  Short Term: Participant states understanding of desired cholesterol values and is compliant with medications prescribed. Participant is following exercise prescription and nutrition guidelines.;Long Term: Cholesterol controlled with medications as prescribed, with individualized exercise RX and with personalized nutrition plan. Value goals: LDL < 70mg69mL > 40 mg.        Personal Goals Discharge: Goals and Risk Factor Review    Row Name 04/28/18 1029 05/28/18 1008           Core Components/Risk Factors/Patient Goals Review   Personal Goals Review  Lipids;Other;Hypertension  Hypertension;Lipids;Other      Review  Shamika Wreathaaking meds sa directed daily.  She thinks she had a prescirption for NTG but may have thrown it out.  She will check on the status of her Rx.  We reviewed correct procedures for taking NTG and the  importance of keeping it with you at all times.  Taking meds as directed.  She does have NTG but did not check expiration date.  She will check when she gets home.  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  She is able to do all her ADL's at home if she rests between.   Amandine Randyot exercising outside HT.  We discussed options.         Expected Outcomes  Short - call Dr about NTG Rx Long - continue to take meds as directed  Short - check expiration on meds Long - have a plan to continue exercise when she finishes HT         Exercise Goals and Review: Exercise Goals    Row Name 04/02/18 1418             Exercise Goals   Increase Physical Activity  Yes       Intervention  Provide advice, education, support and counseling about physical activity/exercise needs.;Develop an individualized exercise prescription for aerobic and  resistive training based on initial evaluation findings, risk stratification, comorbidities and participant's personal goals.       Expected Outcomes  Short Term: Attend rehab on a regular basis to increase amount of physical activity.;Long Term: Add in home exercise to make exercise part of routine and to increase amount of physical activity.;Long Term: Exercising regularly at least 3-5 days a week.       Increase Strength and Stamina  Yes       Intervention  Provide advice, education, support and counseling about physical activity/exercise needs.;Develop an individualized exercise prescription for aerobic and resistive training based on initial evaluation  findings, risk stratification, comorbidities and participant's personal goals.       Expected Outcomes  Short Term: Increase workloads from initial exercise prescription for resistance, speed, and METs.;Short Term: Perform resistance training exercises routinely during rehab and add in resistance training at home;Long Term: Improve cardiorespiratory fitness, muscular endurance and strength as measured by increased METs and functional capacity (6MWT)       Able to understand and use rate of perceived exertion (RPE) scale  Yes       Intervention  Provide education and explanation on how to use RPE scale       Expected Outcomes  Short Term: Able to use RPE daily in rehab to express subjective intensity level;Long Term:  Able to use RPE to guide intensity level when exercising independently       Able to understand and use Dyspnea scale  Yes       Intervention  Provide education and explanation on how to use Dyspnea scale       Expected Outcomes  Short Term: Able to use Dyspnea scale daily in rehab to express subjective sense of shortness of breath during exertion;Long Term: Able to use Dyspnea scale to guide intensity level when exercising independently       Knowledge and understanding of Target Heart Rate Range (THRR)  Yes       Intervention  Provide  education and explanation of THRR including how the numbers were predicted and where they are located for reference       Expected Outcomes  Short Term: Able to state/look up THRR;Short Term: Able to use daily as guideline for intensity in rehab;Long Term: Able to use THRR to govern intensity when exercising independently       Able to check pulse independently  Yes       Intervention  Provide education and demonstration on how to check pulse in carotid and radial arteries.;Review the importance of being able to check your own pulse for safety during independent exercise       Expected Outcomes  Short Term: Able to explain why pulse checking is important during independent exercise;Long Term: Able to check pulse independently and accurately       Understanding of Exercise Prescription  Yes       Intervention  Provide education, explanation, and written materials on patient's individual exercise prescription       Expected Outcomes  Short Term: Able to explain program exercise prescription;Long Term: Able to explain home exercise prescription to exercise independently          Exercise Goals Re-Evaluation: Exercise Goals Re-Evaluation    Row Name 04/07/18 1135 04/13/18 1623 04/28/18 1055 05/11/18 1657 05/26/18 1502     Exercise Goal Re-Evaluation   Exercise Goals Review  Increase Physical Activity;Able to understand and use rate of perceived exertion (RPE) scale;Knowledge and understanding of Target Heart Rate Range (THRR);Understanding of Exercise Prescription;Increase Strength and Stamina  Increase Physical Activity;Increase Strength and Stamina;Understanding of Exercise Prescription  Increase Physical Activity;Increase Strength and Stamina;Able to understand and use rate of perceived exertion (RPE) scale;Able to understand and use Dyspnea scale;Knowledge and understanding of Target Heart Rate Range (THRR);Understanding of Exercise Prescription  Increase Physical Activity;Increase Strength and  Stamina;Understanding of Exercise Prescription  Increase Physical Activity;Increase Strength and Stamina;Understanding of Exercise Prescription   Comments  First full day of exercise!  Patient was oriented to gym and equipment including functions, settings, policies, and procedures.  Patient's individual exercise prescription and treatment plan were reviewed.  All starting workloads were established based on the results of the 6 minute walk test done at initial orientation visit.  The plan for exercise progression was also introduced and progression will be customized based on patient's performance and goals.  Nesreen is off to a good start with rehab.  She has completed two full days of exercise.  We will continue to monitor her progresssion.   Reviewed home exercise with pt today.  Pt plans to walk for exercise.  Reviewed THR, pulse, RPE, sign and symptoms, NTG use, and when to call 911 or MD.  Also discussed weather considerations and indoor options.  Pt voiced understanding.  Tymia has been doing well in rehab.  She is now on level 4 on the BioStep!  We will continue to monitor her progress.   Brion continues to do well in rehab.  She has added incline to her treadmill.  We still need to increase her NuStep.  We will continue to monitor her progress.    Expected Outcomes  Reviewed RPE scale, THR and program prescription with pt today.  Pt voiced understanding and was given a copy of goals to take home.   Short: Continue to attend regularly.  Long; Continue to follow program prescription.   Short - add 1 day per week walking at the mall to program session Long - maintain exercise on her own  Short: Increase NuStep workload.  Long: Continue to increase strength and stamina.   Short: Increase NuStep.  Long: Continue to exercise more at home.    [place] Name 06/09/18 1430             Exercise Goal Re-Evaluation   Exercise Goals Review  Increase Physical Activity;Increase Strength and Stamina;Understanding of  Exercise Prescription       Comments  Calee has been doing well in rehab. She is up to level 4 the NuStep.  We will continue to monitor her progress.        Expected Outcomes  Short: Continue to move up workloads.  Long: Continue to exercise more at home.           Nutrition & Weight - Outcomes: Pre Biometrics - 04/02/18 1417      Pre Biometrics   Height  5' 4.5" (1.638 m)    Weight  125 lb 1.6 oz (56.7 kg)    Waist Circumference  29 inches    Hip Circumference  37 inches    Waist to Hip Ratio  0.78 %    BMI (Calculated)  21.15      Post Biometrics - 06/16/18 1201       Post  Biometrics   Height  5' 4.5" (1.638 m)    Weight  123 lb (55.8 kg)    Waist Circumference  28 inches    Hip Circumference  37 inches    Waist to Hip Ratio  0.76 %    BMI (Calculated)  20.79    Single Leg Stand  1.6 seconds       Nutrition: Nutrition Therapy & Goals - 04/07/18 1121      Nutrition Therapy   Diet  TLC    Drug/Food Interactions  Statins/Certain Fruits    Protein (specify units)  6oz    Fiber  20 grams    Whole Grain Foods  3 servings   does not typically choose whole grains   Saturated Fats  11 max. grams    Fruits and Vegetables  5 servings/day   8 ideal;  eats more vegetables than fruits but eats small portions of all foods   Sodium  2000 grams      Personal Nutrition Goals   Nutrition Goal  Prioritize protein at meal times, especially breakfasts / prior to exercising. For example, rather than a biscuit on its own, add a side of eggs    Personal Goal #2  Because you eat small portions and are trying to regain strength, it would be a good idea to establish an eating routine that allows for smaller/ more frequent meals. This will also be helpful for your husband who would likely do best with smaller meals / finger foods    Personal Goal #3  Eat one additional serving of fruit per week    Comments  She is trying to regain strengh post sugery. Her husband has dementia and does not  remember to eat / does not like to eat without her which presents as a challenge for her. She feels she eats small portions. They typically eat out at [employer] each morning for breakfast, eat a light lunch such as a sandwich or soup, and eat both at home and out for dinners. She tries to select a location with vegetables when out 2x/wk and may go to [facility] to get chili the other night. At home she cooks a variety of meats, vegetables, and occasionally starches. Snacks: cheese, cereal, fruit, some desserts with husband. Beverages: [place] lemonade/tea blend, coffee, water with Crystal Light. Traditionally she gravitated towards salty foods but is trying not to add salt to foods now.       Intervention Plan   Intervention  Prescribe, educate and counsel regarding individualized specific dietary modifications aiming towards targeted core components such as weight, hypertension, lipid management, diabetes, heart failure and other comorbidities.    Expected Outcomes  Short Term Goal: Understand basic principles of dietary content, such as calories, fat, sodium, cholesterol and nutrients.;Short Term Goal: A plan has been developed with personal nutrition goals set during dietitian appointment.;Long Term Goal: Adherence to prescribed nutrition plan.       Nutrition Discharge: Nutrition Assessments - 06/18/18 1306      MEDFICTS Scores   Pre Score  38    Post Score  54    Score Difference  16       Education Questionnaire Score: Knowledge Questionnaire Score - 06/18/18 1310      Knowledge Questionnaire Score   Pre Score  21/26    Post Score  23/26   test reviewed with pt today      Goals reviewed with patient; copy given to patient.

## 2018-06-18 NOTE — Patient Instructions (Signed)
Discharge Patient Instructions  Patient Details  Name: Darlene Barry MRN: [id number] Date of Birth: [birth year] Referring Provider:  Corey Skains, MD   Number of Visits: 82  Reason for Discharge:  Patient reached a stable level of exercise. Patient independent in their exercise. Patient has met program and personal goals.  Smoking History:  Social History   Tobacco Use  Smoking Status Never Smoker  Smokeless Tobacco Never Used    Diagnosis:  S/P CABG x 3  Initial Exercise Prescription: Initial Exercise Prescription - 04/02/18 1400      Date of Initial Exercise RX and Referring Provider   Date  04/02/18    Referring Provider  Nehemiah Massed      Treadmill   MPH  2    Grade  0    Minutes  15    METs  2.5      NuStep   Level  2    SPM  80    Minutes  15    METs  2.2      Biostep-RELP   Level  3    SPM  50    Minutes  15    METs  2      Prescription Details   Frequency (times per week)  2    Duration  Progress to 30 minutes of continuous aerobic without signs/symptoms of physical distress      Intensity   THRR 40-80% of Max Heartrate  100-124    Ratings of Perceived Exertion  11-13    Perceived Dyspnea  0-4      Resistance Training   Training Prescription  Yes    Weight  2 lb    Reps  10-15       Discharge Exercise Prescription (Final Exercise Prescription Changes): Exercise Prescription Changes - 06/09/18 1400      Response to Exercise   Blood Pressure (Admit)  132/70    Blood Pressure (Exercise)  114/60    Blood Pressure (Exit)  100/64    Heart Rate (Admit)  98 bpm    Heart Rate (Exercise)  112 bpm    Heart Rate (Exit)  95 bpm    Rating of Perceived Exertion (Exercise)  12    Symptoms  none    Duration  Continue with 30 min of aerobic exercise without signs/symptoms of physical distress.    Intensity  THRR unchanged      Progression   Progression  Continue to progress workloads to maintain intensity without signs/symptoms of physical  distress.    Average METs  2.79      Resistance Training   Training Prescription  Yes    Weight  3 lbs    Reps  10-15      Interval Training   Interval Training  No      Treadmill   MPH  2    Grade  0.5    Minutes  15    METs  2.67      NuStep   Level  4    Minutes  15    METs  2.7      Biostep-RELP   Level  4    Minutes  15    METs  3      Home Exercise Plan   Plans to continue exercise at  Home (comment)   walking at mall   Frequency  Add 3 additional days to program exercise sessions.    Initial Home Exercises Provided  04/28/18  Functional Capacity: 6 Minute Walk    Row Name 04/02/18 1419 06/16/18 1200       6 Minute Walk   Phase  Initial  Discharge    Distance  1100 feet  1365 feet    Distance % Change  -  24.1 %    Distance Feet Change  -  265 ft    Walk Time  6 minutes  6 minutes    # of Rest Breaks  0  0    MPH  2.08  2.59    METS  2.24  2.55    RPE  -  13    Perceived Dyspnea   -  1    VO2 Peak  -  8.92    Symptoms  No  Yes (comment)    Comments  -  SOB    Resting HR  77 bpm  84 bpm    Resting BP  140/66  110/64    Resting Oxygen Saturation   100 %  -    Exercise Oxygen Saturation  during 6 min walk  100 %  92 %    Max Ex. HR  109 bpm  101 bpm    Max Ex. BP  140/68  128/70    2 Minute Post BP  122/70  -       Quality of Life: Quality of Life - 04/02/18 1509      Quality of Life   Select  Quality of Life      Quality of Life Scores   Health/Function Pre  24.96 %    Socioeconomic Pre  30 %    Psych/Spiritual Pre  26.75 %    Family Pre  28.8 %    GLOBAL Pre  26.75 %       Personal Goals: Goals established at orientation with interventions provided to work toward goal. Personal Goals and Risk Factors at Admission - 04/02/18 1504      Core Components/Risk Factors/Patient Goals on Admission    Weight Management  Yes;Weight Maintenance    Intervention  Weight Management: Develop a combined nutrition and exercise program  designed to reach desired caloric intake, while maintaining appropriate intake of nutrient and fiber, sodium and fats, and appropriate energy expenditure required for the weight goal.;Weight Management: Provide education and appropriate resources to help participant work on and attain dietary goals.    Admit Weight  125 lb (56.7 kg)    Expected Outcomes  Short Term: Continue to assess and modify interventions until short term weight is achieved;Weight Maintenance: Understanding of the daily nutrition guidelines, which includes 25-35% calories from fat, 7% or less cal from saturated fats, less than 267m cholesterol, less than 1.5gm of sodium, & 5 or more servings of fruits and vegetables daily;Long Term: Adherence to nutrition and physical activity/exercise program aimed toward attainment of established weight goal;Understanding recommendations for meals to include 15-35% energy as protein, 25-35% energy from fat, 35-60% energy from carbohydrates, less than 2052mof dietary cholesterol, 20-35 gm of total fiber daily;Understanding of distribution of calorie intake throughout the day with the consumption of 4-5 meals/snacks    Lipids  Yes    Intervention  Provide education and support for participant on nutrition & aerobic/resistive exercise along with prescribed medications to achieve LDL <7065mHDL >41m60m  Expected Outcomes  Short Term: Participant states understanding of desired cholesterol values and is compliant with medications prescribed. Participant is following exercise prescription and nutrition guidelines.;Long Term: Cholesterol controlled with  medications as prescribed, with individualized exercise RX and with personalized nutrition plan. Value goals: LDL < 49m, HDL > 40 mg.        Personal Goals Discharge: Goals and Risk Factor Review - 05/28/18 1008      Core Components/Risk Factors/Patient Goals Review   Personal Goals Review  Hypertension;Lipids;Other    Review  Taking meds as  directed.  She does have NTG but did not check expiration date.  She will check when she gets home.  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  She is able to do all her ADL's at home if she rests between.   MRebekhais not exercising outside HT.  We discussed options.       Expected Outcomes  Short - check expiration on meds Long - have a plan to continue exercise when she finishes HT       Exercise Goals and Review: Exercise Goals    Row Name 04/02/18 1418             Exercise Goals   Increase Physical Activity  Yes       Intervention  Provide advice, education, support and counseling about physical activity/exercise needs.;Develop an individualized exercise prescription for aerobic and resistive training based on initial evaluation findings, risk stratification, comorbidities and participant's personal goals.       Expected Outcomes  Short Term: Attend rehab on a regular basis to increase amount of physical activity.;Long Term: Add in home exercise to make exercise part of routine and to increase amount of physical activity.;Long Term: Exercising regularly at least 3-5 days a week.       Increase Strength and Stamina  Yes       Intervention  Provide advice, education, support and counseling about physical activity/exercise needs.;Develop an individualized exercise prescription for aerobic and resistive training based on initial evaluation findings, risk stratification, comorbidities and participant's personal goals.       Expected Outcomes  Short Term: Increase workloads from initial exercise prescription for resistance, speed, and METs.;Short Term: Perform resistance training exercises routinely during rehab and add in resistance training at home;Long Term: Improve cardiorespiratory fitness, muscular endurance and strength as measured by increased METs and functional capacity (6MWT)       Able to understand and use rate of perceived exertion (RPE) scale  Yes       Intervention  Provide  education and explanation on how to use RPE scale       Expected Outcomes  Short Term: Able to use RPE daily in rehab to express subjective intensity level;Long Term:  Able to use RPE to guide intensity level when exercising independently       Able to understand and use Dyspnea scale  Yes       Intervention  Provide education and explanation on how to use Dyspnea scale       Expected Outcomes  Short Term: Able to use Dyspnea scale daily in rehab to express subjective sense of shortness of breath during exertion;Long Term: Able to use Dyspnea scale to guide intensity level when exercising independently       Knowledge and understanding of Target Heart Rate Range (THRR)  Yes       Intervention  Provide education and explanation of THRR including how the numbers were predicted and where they are located for reference       Expected Outcomes  Short Term: Able to state/look up THRR;Short Term: Able to use daily as guideline  for intensity in rehab;Long Term: Able to use THRR to govern intensity when exercising independently       Able to check pulse independently  Yes       Intervention  Provide education and demonstration on how to check pulse in carotid and radial arteries.;Review the importance of being able to check your own pulse for safety during independent exercise       Expected Outcomes  Short Term: Able to explain why pulse checking is important during independent exercise;Long Term: Able to check pulse independently and accurately       Understanding of Exercise Prescription  Yes       Intervention  Provide education, explanation, and written materials on patient's individual exercise prescription       Expected Outcomes  Short Term: Able to explain program exercise prescription;Long Term: Able to explain home exercise prescription to exercise independently          Exercise Goals Re-Evaluation: Exercise Goals Re-Evaluation    Row Name 04/07/18 1135 04/13/18 1623 04/28/18 1055 05/11/18 1657  05/26/18 1502     Exercise Goal Re-Evaluation   Exercise Goals Review  Increase Physical Activity;Able to understand and use rate of perceived exertion (RPE) scale;Knowledge and understanding of Target Heart Rate Range (THRR);Understanding of Exercise Prescription;Increase Strength and Stamina  Increase Physical Activity;Increase Strength and Stamina;Understanding of Exercise Prescription  Increase Physical Activity;Increase Strength and Stamina;Able to understand and use rate of perceived exertion (RPE) scale;Able to understand and use Dyspnea scale;Knowledge and understanding of Target Heart Rate Range (THRR);Understanding of Exercise Prescription  Increase Physical Activity;Increase Strength and Stamina;Understanding of Exercise Prescription  Increase Physical Activity;Increase Strength and Stamina;Understanding of Exercise Prescription   Comments  First full day of exercise!  Patient was oriented to gym and equipment including functions, settings, policies, and procedures.  Patient's individual exercise prescription and treatment plan were reviewed.  All starting workloads were established based on the results of the 6 minute walk test done at initial orientation visit.  The plan for exercise progression was also introduced and progression will be customized based on patient's performance and goals.  Carine is off to a good start with rehab.  She has completed two full days of exercise.  We will continue to monitor her progresssion.   Reviewed home exercise with pt today.  Pt plans to walk for exercise.  Reviewed THR, pulse, RPE, sign and symptoms, NTG use, and when to call 911 or MD.  Also discussed weather considerations and indoor options.  Pt voiced understanding.  Newell has been doing well in rehab.  She is now on level 4 on the BioStep!  We will continue to monitor her progress.   Tammatha continues to do well in rehab.  She has added incline to her treadmill.  We still need to increase her NuStep.  We will  continue to monitor her progress.    Expected Outcomes  Reviewed RPE scale, THR and program prescription with pt today.  Pt voiced understanding and was given a copy of goals to take home.   Short: Continue to attend regularly.  Long; Continue to follow program prescription.   Short - add 1 day per week walking at the mall to program session Long - maintain exercise on her own  Short: Increase NuStep workload.  Long: Continue to increase strength and stamina.   Short: Increase NuStep.  Long: Continue to exercise more at home.    Cross Name 06/09/18 1430  Exercise Goal Re-Evaluation   Exercise Goals Review  Increase Physical Activity;Increase Strength and Stamina;Understanding of Exercise Prescription       Comments  Mallorey has been doing well in rehab. She is up to level 4 the NuStep.  We will continue to monitor her progress.        Expected Outcomes  Short: Continue to move up workloads.  Long: Continue to exercise more at home.           Nutrition & Weight - Outcomes: Pre Biometrics - 04/02/18 1417      Pre Biometrics   Height  5' 4.5" (1.638 m)    Weight  125 lb 1.6 oz (56.7 kg)    Waist Circumference  29 inches    Hip Circumference  37 inches    Waist to Hip Ratio  0.78 %    BMI (Calculated)  21.15      Post Biometrics - 06/16/18 1201       Post  Biometrics   Height  5' 4.5" (1.638 m)    Weight  123 lb (55.8 kg)    Waist Circumference  28 inches    Hip Circumference  37 inches    Waist to Hip Ratio  0.76 %    BMI (Calculated)  20.79    Single Leg Stand  1.6 seconds       Nutrition: Nutrition Therapy & Goals - 04/07/18 1121      Nutrition Therapy   Diet  TLC    Drug/Food Interactions  Statins/Certain Fruits    Protein (specify units)  6oz    Fiber  20 grams    Whole Grain Foods  3 servings   does not typically choose whole grains   Saturated Fats  11 max. grams    Fruits and Vegetables  5 servings/day   8 ideal; eats more vegetables than fruits but eats  small portions of all foods   Sodium  2000 grams      Personal Nutrition Goals   Nutrition Goal  Prioritize protein at meal times, especially breakfasts / prior to exercising. For example, rather than a biscuit on its own, add a side of eggs    Personal Goal #2  Because you eat small portions and are trying to regain strength, it would be a good idea to establish an eating routine that allows for smaller/ more frequent meals. This will also be helpful for your husband who would likely do best with smaller meals / finger foods    Personal Goal #3  Eat one additional serving of fruit per week    Comments  She is trying to regain strengh post sugery. Her husband has dementia and does not remember to eat / does not like to eat without her which presents as a challenge for her. She feels she eats small portions. They typically eat out at [employer] each morning for breakfast, eat a light lunch such as a sandwich or soup, and eat both at home and out for dinners. She tries to select a location with vegetables when out 2x/wk and may go to [facility] to get chili the other night. At home she cooks a variety of meats, vegetables, and occasionally starches. Snacks: cheese, cereal, fruit, some desserts with husband. Beverages: [place] lemonade/tea blend, coffee, water with Crystal Light. Traditionally she gravitated towards salty foods but is trying not to add salt to foods now.       Intervention Plan   Intervention  Prescribe, educate and  counsel regarding individualized specific dietary modifications aiming towards targeted core components such as weight, hypertension, lipid management, diabetes, heart failure and other comorbidities.    Expected Outcomes  Short Term Goal: Understand basic principles of dietary content, such as calories, fat, sodium, cholesterol and nutrients.;Short Term Goal: A plan has been developed with personal nutrition goals set during dietitian appointment.;Long Term Goal: Adherence  to prescribed nutrition plan.       Nutrition Discharge: Nutrition Assessments - 04/02/18 1510      MEDFICTS Scores   Pre Score  38       Education Questionnaire Score: Knowledge Questionnaire Score - 04/02/18 1505      Knowledge Questionnaire Score   Pre Score  21/26   correct answers reviewed with patient, focus on nutrition, exercise and angina      Goals reviewed with patient; copy given to patient.

## 2018-06-18 NOTE — Progress Notes (Signed)
Cardiac Individual Treatment Plan  Patient Details  Name: Darlene Barry MRN: [id number] Date of Birth: [birth year] Referring Provider:     Cardiac Rehab from 04/02/82 in [facility] Cardiac and Pulmonary Rehab  Referring Provider  Nehemiah Massed      Initial Encounter Date:    Cardiac Rehab from 04/02/82 in [facility] Cardiac and Pulmonary Rehab  Date  04/02/18      Visit Diagnosis: S/P CABG x 3  Patient's Home Medications on Admission:  Current Outpatient Medications:  .  aspirin EC 81 MG tablet, Take 81 mg by mouth daily., Disp: , Rfl:  .  atorvastatin (LIPITOR) 80 MG tablet, Take 1 tablet (80 mg total) by mouth daily., Disp: 30 tablet, Rfl: 11 .  Calcium-Phosphorus-Vitamin D (CITRACAL +D3 PO), Take 1 tablet by mouth daily., Disp: , Rfl:  .  Cholecalciferol (VITAMIN D) 2000 units CAPS, Take 2,000 Units by mouth daily., Disp: , Rfl:  .  clindamycin (CLEOCIN) 300 MG capsule, Take 600 mg by mouth See admin instructions. Take 600 mg by mouth 1 hour prior to dental appointment, Disp: , Rfl:  .  denosumab (PROLIA) 60 MG/ML SOSY injection, Inject 60 mg into the skin every 6 (six) months., Disp: , Rfl:  .  diphenhydramine-acetaminophen (TYLENOL PM) 25-500 MG TABS tablet, Take 0.5 tablets by mouth at bedtime., Disp: , Rfl:  .  docusate sodium (COLACE) 100 MG capsule, Take 1 capsule (100 mg total) by mouth 2 (two) times daily. (Patient taking differently: Take 100 mg by mouth at bedtime. ), Disp: 10 capsule, Rfl: 0 .  esomeprazole (NEXIUM) 20 MG capsule, Take 40 mg by mouth daily. , Disp: , Rfl:  .  ferrous sulfate 325 (65 FE) MG tablet, Take 1 tablet (325 mg total) by mouth 3 (three) times daily after meals. (Patient not taking: Reported on 04/02/2018), Disp: , Rfl: 3 .  gabapentin (NEURONTIN) 100 MG capsule, Take 100 mg by mouth See admin instructions. Take 100 mg by mouth in the morning and take 200 mg by mouth at bedtime, Disp: , Rfl:  .  HYDROcodone-acetaminophen (NORCO) 7.5-325 MG tablet, Take 1-2  tablets by mouth every 4 (four) hours as needed for moderate pain. (Patient not taking: Reported on 01/07/2018), Disp: 60 tablet, Rfl: 0 .  isosorbide mononitrate (IMDUR) 30 MG 24 hr tablet, Take 30 mg by mouth daily., Disp: , Rfl:  .  methocarbamol (ROBAXIN) 500 MG tablet, Take 1 tablet (500 mg total) by mouth every 6 (six) hours as needed for muscle spasms. (Patient not taking: Reported on 01/07/2018), Disp: 30 tablet, Rfl: 0 .  metoprolol succinate (TOPROL-XL) 25 MG 24 hr tablet, Take 25 mg by mouth daily., Disp: , Rfl:  .  Multiple Vitamins-Minerals (PRESERVISION AREDS 2 PO), Take 1 capsule by mouth 2 (two) times daily., Disp: , Rfl:  .  polyethylene glycol (MIRALAX / GLYCOLAX) packet, Take 17 g by mouth 2 (two) times daily. (Patient not taking: Reported on 01/07/2018), Disp: 14 each, Rfl: 0 .  Sennosides (EX-LAX PO), Take 25 mg by mouth at bedtime. , Disp: , Rfl:  .  sennosides-docusate sodium (SENOKOT-S) 8.6-50 MG tablet, Take 1 tablet by mouth at bedtime. , Disp: , Rfl:   Past Medical History: Past Medical History:  Diagnosis Date  . Arthritis   . Detached retina   . Fibrocystic breast disease   . GERD (gastroesophageal reflux disease)   . Hyperlipidemia   . Macular degeneration   . Osteoporosis     Tobacco Use: Social History  Tobacco Use  Smoking Status Never Smoker  Smokeless Tobacco Never Used    Labs: Recent Review Flowsheet Data    There is no flowsheet data to display.       Exercise Target Goals: Exercise Program Goal: Individual exercise prescription set using results from initial 6 min walk test and THRR while considering  patient's activity barriers and safety.   Exercise Prescription Goal: Initial exercise prescription builds to 30-45 minutes a day of aerobic activity, 2-3 days per week.  Home exercise guidelines will be given to patient during program as part of exercise prescription that the participant will acknowledge.  Activity Barriers & Risk  Stratification: Activity Barriers & Cardiac Risk Stratification - 04/02/18 1510      Activity Barriers & Cardiac Risk Stratification   Activity Barriers  Shortness of Breath    Cardiac Risk Stratification  High       6 Minute Walk: 6 Minute Walk    Row Name 04/02/18 1419 06/16/18 1200       6 Minute Walk   Phase  Initial  Discharge    Distance  1100 feet  1365 feet    Distance % Change  -  24.1 %    Distance Feet Change  -  265 ft    Walk Time  6 minutes  6 minutes    # of Rest Breaks  0  0    MPH  2.08  2.59    METS  2.24  2.55    RPE  -  13    Perceived Dyspnea   -  1    VO2 Peak  -  8.92    Symptoms  No  Yes (comment)    Comments  -  SOB    Resting HR  77 bpm  84 bpm    Resting BP  140/66  110/64    Resting Oxygen Saturation   100 %  -    Exercise Oxygen Saturation  during 6 min walk  100 %  92 %    Max Ex. HR  109 bpm  101 bpm    Max Ex. BP  140/68  128/70    2 Minute Post BP  122/70  -       Oxygen Initial Assessment:   Oxygen Re-Evaluation:   Oxygen Discharge (Final Oxygen Re-Evaluation):   Initial Exercise Prescription: Initial Exercise Prescription - 04/02/18 1400      Date of Initial Exercise RX and Referring Provider   Date  04/02/18    Referring Provider  Nehemiah Massed      Treadmill   MPH  2    Grade  0    Minutes  15    METs  2.5      NuStep   Level  2    SPM  80    Minutes  15    METs  2.2      Biostep-RELP   Level  3    SPM  50    Minutes  15    METs  2      Prescription Details   Frequency (times per week)  2    Duration  Progress to 30 minutes of continuous aerobic without signs/symptoms of physical distress      Intensity   THRR 40-80% of Max Heartrate  100-124    Ratings of Perceived Exertion  11-13    Perceived Dyspnea  0-4      Resistance Training   Training Prescription  Yes    Weight  2 lb    Reps  10-15       Perform Capillary Blood Glucose checks as needed.  Exercise Prescription Changes: Exercise  Prescription Changes    Row Name 04/13/18 1600 04/28/18 1600 05/11/18 1600 05/26/18 1500 06/09/18 1400     Response to Exercise   Blood Pressure (Admit)  112/60  120/84  112/60  124/62  132/70   Blood Pressure (Exercise)  160/74  146/64  138/78  128/84  114/60   Blood Pressure (Exit)  114/56  124/70  118/72  124/64  100/64   Heart Rate (Admit)  74 bpm  83 bpm  85 bpm  93 bpm  98 bpm   Heart Rate (Exercise)  117 bpm  101 bpm  98 bpm  109 bpm  112 bpm   Heart Rate (Exit)  102 bpm  78 bpm  81 bpm  92 bpm  95 bpm   Rating of Perceived Exertion (Exercise)  '15  13  13  15  12   '$ Symptoms  none  none  none  none  none   Comments  second full day of exercise  -  -  -  -   Duration  Continue with 30 min of aerobic exercise without signs/symptoms of physical distress.  Continue with 30 min of aerobic exercise without signs/symptoms of physical distress.  Continue with 30 min of aerobic exercise without signs/symptoms of physical distress.  Continue with 30 min of aerobic exercise without signs/symptoms of physical distress.  Continue with 30 min of aerobic exercise without signs/symptoms of physical distress.   Intensity  THRR unchanged  THRR unchanged  THRR unchanged  THRR unchanged  THRR unchanged     Progression   Progression  Continue to progress workloads to maintain intensity without signs/symptoms of physical distress.  Continue to progress workloads to maintain intensity without signs/symptoms of physical distress.  Continue to progress workloads to maintain intensity without signs/symptoms of physical distress.  Continue to progress workloads to maintain intensity without signs/symptoms of physical distress.  Continue to progress workloads to maintain intensity without signs/symptoms of physical distress.   Average METs  2.31  2.41  2.51  2.22  2.79     Resistance Training   Training Prescription  Yes  Yes  Yes  Yes  Yes   Weight  2 lbs  3 lbs  3 lbs  3 lbs  3 lbs   Reps  10-15  10-15  10-15   10-15  10-15     Interval Training   Interval Training  No  No  No  No  No     Treadmill   MPH  '2  2  2  2  2   '$ Grade  0  0  0  0.5  0.5   Minutes  '15  15  15  15  15   '$ METs  2.53  2.53  2.53  2.67  2.67     NuStep   Level  '2  2  2  2  4   '$ Minutes  '15  15  15  15  15   '$ METs  2.1  2.'7  2  2  '$ 2.7     Biostep-RELP   Level  '3  4  4  4  4   '$ Minutes  '15  15  15  15  15   '$ METs  -  '2  3  2  '$ 3  Home Exercise Plan   Plans to continue exercise at  -  Home (comment) walking at [place] (comment) walking at [place] (comment) walking at [place] (comment) walking at mall   Frequency  -  Add 3 additional days to program exercise sessions.  Add 3 additional days to program exercise sessions.  Add 3 additional days to program exercise sessions.  Add 3 additional days to program exercise sessions.   Initial Home Exercises Provided  -  04/28/18  04/28/18  04/28/18  04/28/18      Exercise Comments: Exercise Comments    Row Name 04/07/18 1135 04/28/18 1054 06/18/18 0918       Exercise Comments  First full day of exercise!  Patient was oriented to gym and equipment including functions, settings, policies, and procedures.  Patient's individual exercise prescription and treatment plan were reviewed.  All starting workloads were established based on the results of the 6 minute walk test done at initial orientation visit.  The plan for exercise progression was also introduced and progression will be customized based on patient's performance and goals.  Reviewed home exercise with pt today.  Pt plans to walk for exercise.  Reviewed THR, pulse, RPE, sign and symptoms, NTG use, and when to call 911 or MD.  Also discussed weather considerations and indoor options.  Pt voiced understanding.   Dawnya graduated today from  rehab with 36 sessions completed.  Details of the patient's exercise prescription and what She needs to do in order to continue the prescription and progress were discussed with patient.   Patient was given a copy of prescription and goals.  Patient verbalized understanding.  Roxann plans to continue to exercise by walking at home and volunteering again.        Exercise Goals and Review: Exercise Goals    Row Name 04/02/18 1418             Exercise Goals   Increase Physical Activity  Yes       Intervention  Provide advice, education, support and counseling about physical activity/exercise needs.;Develop an individualized exercise prescription for aerobic and resistive training based on initial evaluation findings, risk stratification, comorbidities and participant's personal goals.       Expected Outcomes  Short Term: Attend rehab on a regular basis to increase amount of physical activity.;Long Term: Add in home exercise to make exercise part of routine and to increase amount of physical activity.;Long Term: Exercising regularly at least 3-5 days a week.       Increase Strength and Stamina  Yes       Intervention  Provide advice, education, support and counseling about physical activity/exercise needs.;Develop an individualized exercise prescription for aerobic and resistive training based on initial evaluation findings, risk stratification, comorbidities and participant's personal goals.       Expected Outcomes  Short Term: Increase workloads from initial exercise prescription for resistance, speed, and METs.;Short Term: Perform resistance training exercises routinely during rehab and add in resistance training at home;Long Term: Improve cardiorespiratory fitness, muscular endurance and strength as measured by increased METs and functional capacity (6MWT)       Able to understand and use rate of perceived exertion (RPE) scale  Yes       Intervention  Provide education and explanation on how to use RPE scale       Expected Outcomes  Short Term: Able to use RPE daily in rehab to express subjective intensity level;Long Term:  Able to  use RPE to guide intensity level when exercising  independently       Able to understand and use Dyspnea scale  Yes       Intervention  Provide education and explanation on how to use Dyspnea scale       Expected Outcomes  Short Term: Able to use Dyspnea scale daily in rehab to express subjective sense of shortness of breath during exertion;Long Term: Able to use Dyspnea scale to guide intensity level when exercising independently       Knowledge and understanding of Target Heart Rate Range (THRR)  Yes       Intervention  Provide education and explanation of THRR including how the numbers were predicted and where they are located for reference       Expected Outcomes  Short Term: Able to state/look up THRR;Short Term: Able to use daily as guideline for intensity in rehab;Long Term: Able to use THRR to govern intensity when exercising independently       Able to check pulse independently  Yes       Intervention  Provide education and demonstration on how to check pulse in carotid and radial arteries.;Review the importance of being able to check your own pulse for safety during independent exercise       Expected Outcomes  Short Term: Able to explain why pulse checking is important during independent exercise;Long Term: Able to check pulse independently and accurately       Understanding of Exercise Prescription  Yes       Intervention  Provide education, explanation, and written materials on patient's individual exercise prescription       Expected Outcomes  Short Term: Able to explain program exercise prescription;Long Term: Able to explain home exercise prescription to exercise independently          Exercise Goals Re-Evaluation : Exercise Goals Re-Evaluation    Row Name 04/07/18 1135 04/13/18 1623 04/28/18 1055 05/11/18 1657 05/26/18 1502     Exercise Goal Re-Evaluation   Exercise Goals Review  Increase Physical Activity;Able to understand and use rate of perceived exertion (RPE) scale;Knowledge and understanding of Target Heart Rate Range  (THRR);Understanding of Exercise Prescription;Increase Strength and Stamina  Increase Physical Activity;Increase Strength and Stamina;Understanding of Exercise Prescription  Increase Physical Activity;Increase Strength and Stamina;Able to understand and use rate of perceived exertion (RPE) scale;Able to understand and use Dyspnea scale;Knowledge and understanding of Target Heart Rate Range (THRR);Understanding of Exercise Prescription  Increase Physical Activity;Increase Strength and Stamina;Understanding of Exercise Prescription  Increase Physical Activity;Increase Strength and Stamina;Understanding of Exercise Prescription   Comments  First full day of exercise!  Patient was oriented to gym and equipment including functions, settings, policies, and procedures.  Patient's individual exercise prescription and treatment plan were reviewed.  All starting workloads were established based on the results of the 6 minute walk test done at initial orientation visit.  The plan for exercise progression was also introduced and progression will be customized based on patient's performance and goals.  Birda is off to a good start with rehab.  She has completed two full days of exercise.  We will continue to monitor her progresssion.   Reviewed home exercise with pt today.  Pt plans to walk for exercise.  Reviewed THR, pulse, RPE, sign and symptoms, NTG use, and when to call 911 or MD.  Also discussed weather considerations and indoor options.  Pt voiced understanding.  Anam has been doing well in rehab.  She is now on level  4 on the BioStep!  We will continue to monitor her progress.   Jakya continues to do well in rehab.  She has added incline to her treadmill.  We still need to increase her NuStep.  We will continue to monitor her progress.    Expected Outcomes  Reviewed RPE scale, THR and program prescription with pt today.  Pt voiced understanding and was given a copy of goals to take home.   Short: Continue to attend  regularly.  Long; Continue to follow program prescription.   Short - add 1 day per week walking at the mall to program session Long - maintain exercise on her own  Short: Increase NuStep workload.  Long: Continue to increase strength and stamina.   Short: Increase NuStep.  Long: Continue to exercise more at home.    [place] Name 06/09/18 1430             Exercise Goal Re-Evaluation   Exercise Goals Review  Increase Physical Activity;Increase Strength and Stamina;Understanding of Exercise Prescription       Comments  Maraya has been doing well in rehab. She is up to level 4 the NuStep.  We will continue to monitor her progress.        Expected Outcomes  Short: Continue to move up workloads.  Long: Continue to exercise more at home.           Discharge Exercise Prescription (Final Exercise Prescription Changes): Exercise Prescription Changes - 06/09/18 1400      Response to Exercise   Blood Pressure (Admit)  132/70    Blood Pressure (Exercise)  114/60    Blood Pressure (Exit)  100/64    Heart Rate (Admit)  98 bpm    Heart Rate (Exercise)  112 bpm    Heart Rate (Exit)  95 bpm    Rating of Perceived Exertion (Exercise)  12    Symptoms  none    Duration  Continue with 30 min of aerobic exercise without signs/symptoms of physical distress.    Intensity  THRR unchanged      Progression   Progression  Continue to progress workloads to maintain intensity without signs/symptoms of physical distress.    Average METs  2.79      Resistance Training   Training Prescription  Yes    Weight  3 lbs    Reps  10-15      Interval Training   Interval Training  No      Treadmill   MPH  2    Grade  0.5    Minutes  15    METs  2.67      NuStep   Level  4    Minutes  15    METs  2.7      Biostep-RELP   Level  4    Minutes  15    METs  3      Home Exercise Plan   Plans to continue exercise at  Home (comment)   walking at mall   Frequency  Add 3 additional days to program exercise sessions.     Initial Home Exercises Provided  04/28/18       Nutrition:  Target Goals: Understanding of nutrition guidelines, daily intake of sodium '1500mg'$ , cholesterol '200mg'$ , calories 30% from fat and 7% or less from saturated fats, daily to have 5 or more servings of fruits and vegetables.  Biometrics: Pre Biometrics - 04/02/18 1417      Pre Biometrics   Height  5' 4.5" (1.638 m)    Weight  125 lb 1.6 oz (56.7 kg)    Waist Circumference  29 inches    Hip Circumference  37 inches    Waist to Hip Ratio  0.78 %    BMI (Calculated)  21.15      Post Biometrics - 06/16/18 1201       Post  Biometrics   Height  5' 4.5" (1.638 m)    Weight  123 lb (55.8 kg)    Waist Circumference  28 inches    Hip Circumference  37 inches    Waist to Hip Ratio  0.76 %    BMI (Calculated)  20.79    Single Leg Stand  1.6 seconds       Nutrition Therapy Plan and Nutrition Goals: Nutrition Therapy & Goals - 04/07/18 1121      Nutrition Therapy   Diet  TLC    Drug/Food Interactions  Statins/Certain Fruits    Protein (specify units)  6oz    Fiber  20 grams    Whole Grain Foods  3 servings   does not typically choose whole grains   Saturated Fats  11 max. grams    Fruits and Vegetables  5 servings/day   8 ideal; eats more vegetables than fruits but eats small portions of all foods   Sodium  2000 grams      Personal Nutrition Goals   Nutrition Goal  Prioritize protein at meal times, especially breakfasts / prior to exercising. For example, rather than a biscuit on its own, add a side of eggs    Personal Goal #2  Because you eat small portions and are trying to regain strength, it would be a good idea to establish an eating routine that allows for smaller/ more frequent meals. This will also be helpful for your husband who would likely do best with smaller meals / finger foods    Personal Goal #3  Eat one additional serving of fruit per week    Comments  She is trying to regain strengh post sugery. Her  husband has dementia and does not remember to eat / does not like to eat without her which presents as a challenge for her. She feels she eats small portions. They typically eat out at [employer] each morning for breakfast, eat a light lunch such as a sandwich or soup, and eat both at home and out for dinners. She tries to select a location with vegetables when out 2x/wk and may go to [facility] to get chili the other night. At home she cooks a variety of meats, vegetables, and occasionally starches. Snacks: cheese, cereal, fruit, some desserts with husband. Beverages: [place] lemonade/tea blend, coffee, water with Crystal Light. Traditionally she gravitated towards salty foods but is trying not to add salt to foods now.       Intervention Plan   Intervention  Prescribe, educate and counsel regarding individualized specific dietary modifications aiming towards targeted core components such as weight, hypertension, lipid management, diabetes, heart failure and other comorbidities.    Expected Outcomes  Short Term Goal: Understand basic principles of dietary content, such as calories, fat, sodium, cholesterol and nutrients.;Short Term Goal: A plan has been developed with personal nutrition goals set during dietitian appointment.;Long Term Goal: Adherence to prescribed nutrition plan.       Nutrition Assessments: Nutrition Assessments - 06/18/18 1306      MEDFICTS Scores   Pre Score  38    Post Score  54    Score Difference  16       Nutrition Goals Re-Evaluation: Nutrition Goals Re-Evaluation    [place] Name 04/07/18 1134 05/19/18 1013           Goals   Nutrition Goal  Because you eat small portions and are trying to regain strength, it would be a good idea to establish an eating routine that allows for smaller/ more frequent meals. This will also be helpful for your husband who would likely do best with smaller meals / finger foods  Establish a regular eating routine that allows for  smaller/ more frequent meals to help improve energy d/t early satiety at meal times; Eat one additional serving of fruit per week; Prioritize protein at meal times      Comment  She eats 2-3 meals / day currently with the occasional snack. Her husband thrives best on routine, which includes his eating schedule. Each of them fill up quickly at meal times and could benefit from smaller / more frequent meals  She feels that she has been eating well recently. She is eating different protein sources but has not yet added more fruits into her diet. Recently she and her husband have been ordering child-sized meals when eating out, stating that this serving size is more appropriate for their appetites. She has also been drinking more water d/t being "on the edge of dehydration" during her last physical. Sometimes she adds Crystal Light flavoring to encourage herself to drink more      Expected Outcome  She will eat on a regular/ more frequent schedule and prioritize protein sources. She will try to provide some finger foods for her husband to encourage wt gain  Continue to increase fluid intake and to prioritize protein intake daily. Long term goal: eat more fruit and eat more frequently to prevent wt loss        Personal Goal #2 Re-Evaluation   Personal Goal #2  Eat one additional serving of fruit per week  -        Personal Goal #3 Re-Evaluation   Personal Goal #3  Prioritize protein at meal times, especially breakfasts/ prior to exercising. For example, rather than a biscuit on its own, add a side of eggs  -         Nutrition Goals Discharge (Final Nutrition Goals Re-Evaluation): Nutrition Goals Re-Evaluation - 05/19/18 1013      Goals   Nutrition Goal  Establish a regular eating routine that allows for smaller/ more frequent meals to help improve energy d/t early satiety at meal times; Eat one additional serving of fruit per week; Prioritize protein at meal times    Comment  She feels that she has been  eating well recently. She is eating different protein sources but has not yet added more fruits into her diet. Recently she and her husband have been ordering child-sized meals when eating out, stating that this serving size is more appropriate for their appetites. She has also been drinking more water d/t being "on the edge of dehydration" during her last physical. Sometimes she adds Crystal Light flavoring to encourage herself to drink more    Expected Outcome  Continue to increase fluid intake and to prioritize protein intake daily. Long term goal: eat more fruit and eat more frequently to prevent wt loss       Psychosocial: Target Goals: Acknowledge presence or absence of significant depression and/or stress, maximize coping skills, provide positive support system. Participant is able  to verbalize types and ability to use techniques and skills needed for reducing stress and depression.   Initial Review & Psychosocial Screening: Initial Psych Review & Screening - 04/02/18 1506      Initial Review   Current issues with  Current Stress Concerns    Source of Stress Concerns  Family    Comments  Amorie's husband has dementia. He still drives some. She was thankful she had family stay during her post op time to help out. Now it is back to just the two of them which she thinks will help him get back on track. He sleeps all night which means she can sleep all night. She is a very active 85 year old, loves to volunteer at the [employer] and hopes to get back to her 2 days a week come January.       Family Dynamics   Good Support System?  Yes   spouse, children     Barriers   Psychosocial barriers to participate in program  The patient should benefit from training in stress management and relaxation.      Screening Interventions   Interventions  Encouraged to exercise;Program counselor consult;To provide support and resources with identified psychosocial needs;Provide  feedback about the scores to participant    Expected Outcomes  Short Term goal: Utilizing psychosocial counselor, staff and physician to assist with identification of specific Stressors or current issues interfering with healing process. Setting desired goal for each stressor or current issue identified.;Long Term Goal: Stressors or current issues are controlled or eliminated.;Short Term goal: Identification and review with participant of any Quality of Life or Depression concerns found by scoring the questionnaire.;Long Term goal: The participant improves quality of Life and PHQ9 Scores as seen by post scores and/or verbalization of changes       Quality of Life Scores:  Quality of Life - 06/18/18 1309      Quality of Life Scores   Health/Function Pre  24.96 %    Health/Function Post  25.7 %    Health/Function % Change  2.96 %    Socioeconomic Pre  30 %    Socioeconomic Post  29.58 %    Socioeconomic % Change   -1.4 %    Psych/Spiritual Pre  26.75 %    Psych/Spiritual Post  25.56 %    Psych/Spiritual % Change  -4.45 %    Family Pre  28.8 %    Family Post  28.8 %    Family % Change  0 %    GLOBAL Pre  26.75 %    GLOBAL Post  26.8 %    GLOBAL % Change  0.19 %      Scores of 19 and below usually indicate a poorer quality of life in these areas.  A difference of  2-3 points is a clinically meaningful difference.  A difference of 2-3 points in the total score of the Quality of Life Index has been associated with significant improvement in overall quality of life, self-image, physical symptoms, and general health in studies assessing change in quality of life.  PHQ-9: Recent Review Flowsheet Data    Depression screen [facility] 2/9 06/18/2018 04/02/2018   Decreased Interest 0 0   Down, Depressed, Hopeless 0 0   PHQ - 2 Score 0 0   Altered sleeping 0 1   Tired, decreased energy 1 1   Change in appetite 0 0   Feeling bad or failure about yourself  0  0   Trouble concentrating 0 0   Moving  slowly or fidgety/restless 0 0   Suicidal thoughts 0 0   PHQ-9 Score 1 2   Difficult doing work/chores Not difficult at all Not difficult at all     Interpretation of Total Score  Total Score Depression Severity:  1-4 = Minimal depression, 5-9 = Mild depression, 10-14 = Moderate depression, 15-19 = Moderately severe depression, 20-27 = Severe depression   Psychosocial Evaluation and Intervention: Psychosocial Evaluation - 04/09/18 1040      Psychosocial Evaluation & Interventions   Interventions  Encouraged to exercise with the program and follow exercise prescription;Stress management education    Comments  Counselor met with Ms. [facility]) today for initial psychosocial evaluation.  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  She has a strong support system with a spouse of 77 years; (5) adult children between steps and biological; actively involved in her church and the [employer] for over 20 years and good neighbors!Stanton Kidney reports sleeping well and has a good appetite.  She denies a history of depression or anxiety or any current symptoms and is typically in a positive mood.  Alizandra has stress in her life with her own health and recovery and that of her spouse who is 77 and has Alzheimer's.  Maribella has goals for increasing her stamina and strength and wants to get back to her normal activities - including volunteering 8 hours/week.  Staff will follow with her.     Expected Outcomes  Short:  Schylar will exercise for her health and for her stress at this time.  Long:  Shawnice will develop a routine of positive self-care to be able to manage her stress and own health needs.    Continue Psychosocial Services   Follow up required by staff       Psychosocial Re-Evaluation: Psychosocial Re-Evaluation    [place] Name 04/28/18 1033 05/28/18 1018           Psychosocial Re-Evaluation   Current issues with  Current Stress Concerns  Current Stress Concerns      Comments  Sumayyah is  feeling better since starting HT.  She is able to do daily house work as good as before "maybe better".  She is not as SOB as she was before surgery.  The stress with her husband is less since she has been home and they are able to get back to regular routine.  She is happy with her progress and doesnt need to see her Dr until March.  Syana has some stress related to taking care of things like car oil changes and getting her and her spouse to Dr appointments.  She doesnt want to commit to a regular exercise program due to their schedule - her volunteer work and Dr appointments.      Expected Outcomes  Short - continue to exercise for stress management Long - get back to volunteer activties and all activties she enjoys  Short - develop a plan to continue exercise and manage home needs  when she finishes HT Long - maintain independence with ADLs      Interventions  Encouraged to attend Cardiac Rehabilitation for the exercise  Encouraged to attend Cardiac Rehabilitation for the exercise      Continue Psychosocial Services   Follow up required by staff  Follow up required by staff         Psychosocial Discharge (Final Psychosocial Re-Evaluation): Psychosocial  Re-Evaluation - 05/28/18 1018      Psychosocial Re-Evaluation   Current issues with  Current Stress Concerns    Comments  Angelynn has some stress related to taking care of things like car oil changes and getting her and her spouse to Dr appointments.  She doesnt want to commit to a regular exercise program due to their schedule - her volunteer work and Dr appointments.    Expected Outcomes  Short - develop a plan to continue exercise and manage home needs  when she finishes HT Long - maintain independence with ADLs    Interventions  Encouraged to attend Cardiac Rehabilitation for the exercise    Continue Psychosocial Services   Follow up required by staff       Vocational Rehabilitation: Provide vocational rehab assistance to qualifying  candidates.   Vocational Rehab Evaluation & Intervention: Vocational Rehab - 04/02/18 1506      Initial Vocational Rehab Evaluation & Intervention   Assessment shows need for Vocational Rehabilitation  No       Education: Education Goals: Education classes will be provided on a variety of topics geared toward better understanding of heart health and risk factor modification. Participant will state understanding/return demonstration of topics presented as noted by education test scores.  Learning Barriers/Preferences: Learning Barriers/Preferences - 04/02/18 1505      Learning Barriers/Preferences   Learning Barriers  None    Learning Preferences  Individual Instruction;Verbal Instruction       Education Topics:  AED/CPR: - Group verbal and written instruction with the use of models to demonstrate the basic use of the AED with the basic ABC's of resuscitation.   Cardiac Rehab from 06/18/2018 in [facility] Cardiac and Pulmonary Rehab  Date  05/19/18  Educator  CE  Instruction Review Code  1- Verbalizes Understanding      General Nutrition Guidelines/Fats and Fiber: -Group instruction provided by verbal, written material, models and posters to present the general guidelines for heart healthy nutrition. Gives an explanation and review of dietary fats and fiber.   Cardiac Rehab from 06/18/2018 in [facility] Cardiac and Pulmonary Rehab  Date  06/02/18  Educator  LB  Instruction Review Code  1- Verbalizes Understanding      Controlling Sodium/Reading Food Labels: -Group verbal and written material supporting the discussion of sodium use in heart healthy nutrition. Review and explanation with models, verbal and written materials for utilization of the food label.   Cardiac Rehab from 06/18/2018 in [facility] Cardiac and Pulmonary Rehab  Date  06/04/18  Educator  LB  Instruction Review Code  1- Verbalizes Understanding      Exercise Physiology & General Exercise Guidelines: - Group verbal and  written instruction with models to review the exercise physiology of the cardiovascular system and associated critical values. Provides general exercise guidelines with specific guidelines to those with heart or lung disease.    Cardiac Rehab from 06/18/2018 in [facility] Cardiac and Pulmonary Rehab  Date  06/09/18  Educator  [facility]  Instruction Review Code  1- Verbalizes Understanding      Aerobic Exercise & Resistance Training: - Gives group verbal and written instruction on the various components of exercise. Focuses on aerobic and resistive training programs and the benefits of this training and how to safely progress through these programs..   Cardiac Rehab from 06/18/2018 in [facility] Cardiac and Pulmonary Rehab  Date  06/11/18  Educator  [place]  Instruction Review Code  1- Geologist, engineering, Balance,  Mind/Body Relaxation: Provides group verbal/written instruction on the benefits of flexibility and balance training, including mind/body exercise modes such as yoga, pilates and tai chi.  Demonstration and skill practice provided.   Cardiac Rehab from 06/18/2018 in [facility] Cardiac and Pulmonary Rehab  Date  06/16/18  Educator  AS  Instruction Review Code  1- Verbalizes Understanding      Stress and Anxiety: - Provides group verbal and written instruction about the health risks of elevated stress and causes of high stress.  Discuss the correlation between heart/lung disease and anxiety and treatment options. Review healthy ways to manage with stress and anxiety.   Depression: - Provides group verbal and written instruction on the correlation between heart/lung disease and depressed mood, treatment options, and the stigmas associated with seeking treatment.   Cardiac Rehab from 06/18/2018 in [facility] Cardiac and Pulmonary Rehab  Date  05/26/18  Educator  [facility]  Instruction Review Code  1- Verbalizes Understanding      Anatomy & Physiology of the Heart: - Group verbal and written  instruction and models provide basic cardiac anatomy and physiology, with the coronary electrical and arterial systems. Review of Valvular disease and Heart Failure   Cardiac Procedures: - Group verbal and written instruction to review commonly prescribed medications for heart disease. Reviews the medication, class of the drug, and side effects. Includes the steps to properly store meds and maintain the prescription regimen. (beta blockers and nitrates)   Cardiac Rehab from 06/18/2018 in [facility] Cardiac and Pulmonary Rehab  Date  04/28/18  Educator  SB  Instruction Review Code  1- Verbalizes Understanding      Cardiac Medications I: - Group verbal and written instruction to review commonly prescribed medications for heart disease. Reviews the medication, class of the drug, and side effects. Includes the steps to properly store meds and maintain the prescription regimen.   Cardiac Rehab from 06/18/2018 in [facility] Cardiac and Pulmonary Rehab  Date  05/05/18  Educator  [place]  Instruction Review Code  1- Verbalizes Understanding      Cardiac Medications II: -Group verbal and written instruction to review commonly prescribed medications for heart disease. Reviews the medication, class of the drug, and side effects. (all other drug classes)   Cardiac Rehab from 06/18/2018 in [facility] Cardiac and Pulmonary Rehab  Date  06/18/18  Educator  CE  Instruction Review Code  1- Verbalizes Understanding       Go Sex-Intimacy & Heart Disease, Get SMART - Goal Setting: - Group verbal and written instruction through game format to discuss heart disease and the return to sexual intimacy. Provides group verbal and written material to discuss and apply goal setting through the application of the S.M.A.R.T. Method.   Cardiac Rehab from 06/18/2018 in [facility] Cardiac and Pulmonary Rehab  Date  04/28/18  Educator  SB  Instruction Review Code  1- Verbalizes Understanding      Other Matters of the Heart: - Provides group  verbal, written materials and models to describe Stable Angina and Peripheral Artery. Includes description of the disease process and treatment options available to the cardiac patient.   Cardiac Rehab from 06/18/2018 in [facility] Cardiac and Pulmonary Rehab  Date  04/30/18  Educator  CE  Instruction Review Code  1- Verbalizes Understanding      Exercise & Equipment Safety: - Individual verbal instruction and demonstration of equipment use and safety with use of the equipment.   Cardiac Rehab from 06/18/2018 in [facility] Cardiac and Pulmonary Rehab  Date  04/02/18  Educator  [place]  Instruction Review Code  1- Verbalizes Understanding      Infection Prevention: - Provides verbal and written material to individual with discussion of infection control including proper hand washing and proper equipment cleaning during exercise session.   Cardiac Rehab from 06/18/2018 in [facility] Cardiac and Pulmonary Rehab  Date  04/02/18  Educator  [facility]  Instruction Review Code  1- Verbalizes Understanding      Falls Prevention: - Provides verbal and written material to individual with discussion of falls prevention and safety.   Cardiac Rehab from 06/18/2018 in [facility] Cardiac and Pulmonary Rehab  Date  04/02/18  Educator  [facility]  Instruction Review Code  1- Verbalizes Understanding      Diabetes: - Individual verbal and written instruction to review signs/symptoms of diabetes, desired ranges of glucose level fasting, after meals and with exercise. Acknowledge that pre and post exercise glucose checks will be done for 3 sessions at entry of program.   Know Your Numbers and Risk Factors: -Group verbal and written instruction about important numbers in your health.  Discussion of what are risk factors and how they play a role in the disease process.  Review of Cholesterol, Blood Pressure, Diabetes, and BMI and the role they play in your overall health.   Cardiac Rehab from 06/18/2018 in [facility] Cardiac and Pulmonary Rehab  Date   06/18/18  Educator  CE  Instruction Review Code  1- Verbalizes Understanding      Sleep Hygiene: -Provides group verbal and written instruction about how sleep can affect your health.  Define sleep hygiene, discuss sleep cycles and impact of sleep habits. Review good sleep hygiene tips.    Other: -Provides group and verbal instruction on various topics (see comments)   Knowledge Questionnaire Score: Knowledge Questionnaire Score - 06/18/18 1310      Knowledge Questionnaire Score   Pre Score  21/26    Post Score  23/26   test reviewed with pt today      Core Components/Risk Factors/Patient Goals at Admission: Personal Goals and Risk Factors at Admission - 04/02/18 1504      Core Components/Risk Factors/Patient Goals on Admission    Weight Management  Yes;Weight Maintenance    Intervention  Weight Management: Develop a combined nutrition and exercise program designed to reach desired caloric intake, while maintaining appropriate intake of nutrient and fiber, sodium and fats, and appropriate energy expenditure required for the weight goal.;Weight Management: Provide education and appropriate resources to help participant work on and attain dietary goals.    Admit Weight  125 lb (56.7 kg)    Expected Outcomes  Short Term: Continue to assess and modify interventions until short term weight is achieved;Weight Maintenance: Understanding of the daily nutrition guidelines, which includes 25-35% calories from fat, 7% or less cal from saturated fats, less than '200mg'$  cholesterol, less than 1.5gm of sodium, & 5 or more servings of fruits and vegetables daily;Long Term: Adherence to nutrition and physical activity/exercise program aimed toward attainment of established weight goal;Understanding recommendations for meals to include 15-35% energy as protein, 25-35% energy from fat, 35-60% energy from carbohydrates, less than '200mg'$  of dietary cholesterol, 20-35 gm of total fiber daily;Understanding of  distribution of calorie intake throughout the day with the consumption of 4-5 meals/snacks    Lipids  Yes    Intervention  Provide education and support for participant on nutrition & aerobic/resistive exercise along with prescribed medications to achieve LDL '70mg'$ , HDL >'40mg'$ .    Expected  Outcomes  Short Term: Participant states understanding of desired cholesterol values and is compliant with medications prescribed. Participant is following exercise prescription and nutrition guidelines.;Long Term: Cholesterol controlled with medications as prescribed, with individualized exercise RX and with personalized nutrition plan. Value goals: LDL < '70mg'$ , HDL > 40 mg.       Core Components/Risk Factors/Patient Goals Review:  Goals and Risk Factor Review    Row Name 04/28/18 1029 05/28/18 1008           Core Components/Risk Factors/Patient Goals Review   Personal Goals Review  Lipids;Other;Hypertension  Hypertension;Lipids;Other      Review  Brightyn is taking meds sa directed daily.  She thinks she had a prescirption for NTG but may have thrown it out.  She will check on the status of her Rx.  We reviewed correct procedures for taking NTG and the  importance of keeping it with you at all times.  Taking meds as directed.  She does have NTG but did not check expiration date.  She will check when she gets home.  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  She is able to do all her ADL's at home if she rests between.   Faryn is not exercising outside HT.  We discussed options.         Expected Outcomes  Short - call Dr about NTG Rx Long - continue to take meds as directed  Short - check expiration on meds Long - have a plan to continue exercise when she finishes HT         Core Components/Risk Factors/Patient Goals at Discharge (Final Review):  Goals and Risk Factor Review - 05/28/18 1008      Core Components/Risk Factors/Patient Goals Review   Personal Goals Review  Hypertension;Lipids;Other     Review  Taking meds as directed.  She does have NTG but did not check expiration date.  She will check when she gets home.  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  She is able to do all her ADL's at home if she rests between.   Doryce is not exercising outside HT.  We discussed options.       Expected Outcomes  Short - check expiration on meds Long - have a plan to continue exercise when she finishes HT       ITP Comments: ITP Comments    Row Name 04/02/18 1453 04/22/18 0606 05/19/18 7517 06/17/18 0947 06/18/18 0919   ITP Comments  Med Review completed. Initial ITP created. Diagnosis can be found in Care Everywhere 9/19  30 day review. Continue with ITP unless direccted changes per Medical Director Chart Review. New to program  30 Day Review. Continue with ITP unless directed changes per Medical Director review  30 Day Review. Continue with ITP unless directed changes per Medical Director review  Discharge ITP sent and signed by Dr. Sabra Heck.  Discharge Summary routed to PCP and cardiologist.      Comments: Discharge ITP

## 2018-12-03 ENCOUNTER — Emergency Department
Admission: EM | Admit: 2018-12-03 | Discharge: 2018-12-03 | Disposition: A | Discharge status: Home / Self Care | Payer: Medicare Other | Attending: Emergency Medicine | Admitting: Emergency Medicine

## 2018-12-03 ENCOUNTER — Encounter: Payer: Self-pay | Admitting: Physician Assistant

## 2018-12-03 ENCOUNTER — Other Ambulatory Visit: Payer: Self-pay

## 2018-12-03 DIAGNOSIS — Y929 Unspecified place or not applicable: Secondary | ICD-10-CM | POA: Diagnosis not present

## 2018-12-03 DIAGNOSIS — Z79899 Other long term (current) drug therapy: Secondary | ICD-10-CM | POA: Diagnosis not present

## 2018-12-03 DIAGNOSIS — I2510 Atherosclerotic heart disease of native coronary artery without angina pectoris: Secondary | ICD-10-CM | POA: Diagnosis not present

## 2018-12-03 DIAGNOSIS — X58XXXA Exposure to other specified factors, initial encounter: Secondary | ICD-10-CM | POA: Diagnosis not present

## 2018-12-03 DIAGNOSIS — Z951 Presence of aortocoronary bypass graft: Secondary | ICD-10-CM | POA: Diagnosis not present

## 2018-12-03 DIAGNOSIS — Y999 Unspecified external cause status: Secondary | ICD-10-CM | POA: Diagnosis not present

## 2018-12-03 DIAGNOSIS — Y9389 Activity, other specified: Secondary | ICD-10-CM | POA: Diagnosis not present

## 2018-12-03 DIAGNOSIS — Z7982 Long term (current) use of aspirin: Secondary | ICD-10-CM | POA: Insufficient documentation

## 2018-12-03 DIAGNOSIS — T17208A Unspecified foreign body in pharynx causing other injury, initial encounter: Secondary | ICD-10-CM | POA: Diagnosis not present

## 2018-12-03 DIAGNOSIS — Z96642 Presence of left artificial hip joint: Secondary | ICD-10-CM | POA: Insufficient documentation

## 2018-12-03 DIAGNOSIS — R0989 Other specified symptoms and signs involving the circulatory and respiratory systems: Secondary | ICD-10-CM | POA: Diagnosis present

## 2018-12-03 MED ORDER — LIDOCAINE VISCOUS HCL 2 % MT SOLN
15.0000 mL | Freq: Once | OROMUCOSAL | Status: AC
  Administered 2018-12-03: 15 mL via OROMUCOSAL

## 2018-12-03 MED ORDER — LIDOCAINE VISCOUS HCL 2 % MT SOLN
15.0000 mL | Freq: Once | OROMUCOSAL | Status: AC
  Administered 2018-12-03: 23:00:00 15 mL via OROMUCOSAL
  Filled 2018-12-03: qty 15

## 2018-12-03 MED ORDER — ALUM & MAG HYDROXIDE-SIMETH 200-200-20 MG/5ML PO SUSP
30.0000 mL | Freq: Once | ORAL | Status: AC
  Administered 2018-12-03: 22:00:00 30 mL via ORAL

## 2018-12-03 NOTE — ED Provider Notes (Signed)
[facility] Emergency Department Provider Note ____________________________________________  Time seen: 2250  I have reviewed the triage vital signs and the nursing notes.  HISTORY  Chief Complaint  Pill stuck in throat  HPI Darlene Barry is a 85 y.o. female presents herself to the ED for evaluation of a foreign body sensation to the throat.  Patient arrives feeling as though she has a pill stuck in her throat.  She describes taking her Colace p.o. earlier this evening, and has had a foreign body sensation since that time.  Patient attempted to eat some crackers following the pill, but felt as though she was choking.  She presents now for further evaluation.  [facility], the patient apparently coughed and was able to cough out of heel still intact.  She denies any other complaints at this time other than a mild "tickle" in her throat.  Past Medical History:  Diagnosis Date  . Arthritis   . Detached retina   . Fibrocystic breast disease   . GERD (gastroesophageal reflux disease)   . Hyperlipidemia   . Macular degeneration   . Osteoporosis     Patient Active Problem List   Diagnosis Date Noted  . Arthritis 04/02/2018  . GERD (gastroesophageal reflux disease) 04/02/2018  . Hyperlipidemia 04/02/2018  . Osteoporosis, post-menopausal 04/02/2018  . Carotid stenosis, bilateral 02/06/2018  . S/P CABG x 3 02/06/2018  . Coronary artery disease involving native coronary artery of native heart 01/22/2018  . Angina decubitus (HCC) 01/07/2018  . S/P left THA, AA 07/02/2016  . S/P hip replacement 07/02/2016    Past Surgical History:  Procedure Laterality Date  . BREAST EXCISIONAL BIOPSY Right 1970   NEG  . CATARACT EXTRACTION, BILATERAL    . HIP SURGERY     fractured hip, has screws in place  . LEFT HEART CATH AND CORONARY ANGIOGRAPHY Left 01/14/2018   Procedure: LEFT HEART CATH AND CORONARY ANGIOGRAPHY;  Surgeon: [place] BlinksKowalski, Bruce J, MD;   Location: [facility] INVASIVE CV LAB;  Service: Cardiovascular;  Laterality: Left;  . TOTAL HIP ARTHROPLASTY Left 07/02/2016   Procedure: LEFT TOTAL HIP ARTHROPLASTY ANTERIOR APPROACH;  Surgeon: Durene RomansMatthew Olin, MD;  Location: [facility] ORS;  Service: Orthopedics;  Laterality: Left;    Prior to Admission medications   Medication Sig Start Date End Date Taking? Authorizing Provider  aspirin EC 81 MG tablet Take 81 mg by mouth daily.    [provider]  atorvastatin (LIPITOR) 80 MG tablet Take 1 tablet (80 mg total) by mouth daily. 01/14/18 01/14/19  [place] BlinksKowalski, Bruce J, MD  Calcium-Phosphorus-Vitamin D (CITRACAL +D3 PO) Take 1 tablet by mouth daily.    [provider]  Cholecalciferol (VITAMIN D) 2000 units CAPS Take 2,000 Units by mouth daily.    [provider]  clindamycin (CLEOCIN) 300 MG capsule Take 600 mg by mouth See admin instructions. Take 600 mg by mouth 1 hour prior to dental appointment    [provider]  denosumab (PROLIA) 60 MG/ML SOSY injection Inject 60 mg into the skin every 6 (six) months.    [provider]  diphenhydramine-acetaminophen (TYLENOL PM) 25-500 MG TABS tablet Take 0.5 tablets by mouth at bedtime.    [provider]  docusate sodium (COLACE) 100 MG capsule Take 1 capsule (100 mg total) by mouth 2 (two) times daily. Patient taking differently: Take 100 mg by mouth at bedtime.  07/04/16   [place] GinsBabish, Matthew, PA-C  esomeprazole (NEXIUM) 20 MG capsule Take 40 mg  by mouth daily.     [provider]  ferrous sulfate 325 (65 FE) MG tablet Take 1 tablet (325 mg total) by mouth 3 (three) times daily after meals. Patient not taking: Reported on 04/02/2018 07/04/16   [place] GinsBabish, Matthew, PA-C  gabapentin (NEURONTIN) 100 MG capsule Take 100 mg by mouth See admin instructions. Take 100 mg by mouth in the morning and take 200 mg by mouth at bedtime    [provider]  HYDROcodone-acetaminophen (NORCO) 7.5-325 MG tablet Take 1-2 tablets  by mouth every 4 (four) hours as needed for moderate pain. Patient not taking: Reported on 01/07/2018 07/04/16   [place] GinsBabish, Matthew, PA-C  isosorbide mononitrate (IMDUR) 30 MG 24 hr tablet Take 30 mg by mouth daily.    [provider]  methocarbamol (ROBAXIN) 500 MG tablet Take 1 tablet (500 mg total) by mouth every 6 (six) hours as needed for muscle spasms. Patient not taking: Reported on 01/07/2018 07/04/16   Lanney GinsBabish, Matthew, PA-C  metoprolol succinate (TOPROL-XL) 25 MG 24 hr tablet Take 25 mg by mouth daily.    [provider]  Multiple Vitamins-Minerals (PRESERVISION AREDS 2 PO) Take 1 capsule by mouth 2 (two) times daily.    [provider]  polyethylene glycol (MIRALAX / GLYCOLAX) packet Take 17 g by mouth 2 (two) times daily. Patient not taking: Reported on 01/07/2018 07/04/16   Lanney GinsBabish, Matthew, PA-C  Sennosides (EX-LAX PO) Take 25 mg by mouth at bedtime.     [provider]  sennosides-docusate sodium (SENOKOT-S) 8.6-50 MG tablet Take 1 tablet by mouth at bedtime.     [provider]    Allergies Amoxicillin and Sulfa antibiotics  Family History  Problem Relation Age of Onset  . Breast cancer Daughter [id number]  . Breast cancer Paternal Aunt   . Breast cancer Paternal Aunt     Social History Social History   Tobacco Use  . Smoking status: Never Smoker  . Smokeless tobacco: Never Used  Substance Use Topics  . Alcohol use: No  . Drug use: No    Review of Systems  Constitutional: Negative for fever. Eyes: Negative for visual changes. ENT: Negative for sore throat.  Foreign body sensation to the throat as above. Cardiovascular: Negative for chest pain. Respiratory: Negative for shortness of breath. Gastrointestinal: Negative for abdominal pain, vomiting and diarrhea. Genitourinary: Negative for dysuria. Musculoskeletal: Negative for back pain. Skin: Negative for rash. Neurological: Negative for headaches, focal weakness or  numbness. ____________________________________________  PHYSICAL EXAM:  VITAL SIGNS: ED Triage Vitals  Enc Vitals Group     BP 12/03/18 2150 (!) 163/71     Pulse Rate 12/03/18 2150 79     Resp 12/03/18 2150 16     Temp 12/03/18 2150 98.7 F (37.1 C)     Temp Source 12/03/18 2150 Oral     SpO2 12/03/18 2150 94 %     Weight 12/03/18 2151 120 lb (54.4 kg)     Height 12/03/18 2151 5\' 3"  (1.6 m)     Head Circumference --      Peak Flow --      Pain Score 12/03/18 2150 5     Pain Loc --      Pain Edu? --      Excl. in GC? --     Constitutional: Alert and oriented. Well appearing and in no distress. Head: Normocephalic and atraumatic. Eyes: Conjunctivae are normal. Normal extraocular movements Mouth/Throat: Mucous membranes are moist. Neck: Supple. No thyromegaly. Hematological/Lymphatic/Immunological: No  cervical lymphadenopathy. Cardiovascular: Normal rate, regular rhythm. Normal distal pulses. Respiratory: Normal respiratory effort. No wheezes/rales/rhonchi. Gastrointestinal: Soft and nontender. No distention. Musculoskeletal: Nontender with normal range of motion in all extremities.  Neurologic:  Normal gait without ataxia. Normal speech and language. No gross focal neurologic deficits are appreciated. Skin:  Skin is warm, dry and intact. No rash noted. Psychiatric: Mood and affect are normal. Patient exhibits appropriate insight and judgment. ____________________________________________  PROCEDURES  Procedures Maalox suspension 30 ml PO Viscous lido 2% 15 ml PO PO Challenge - water ____________________________________________  INITIAL IMPRESSION / ASSESSMENT AND PLAN / ED COURSE  RAYMIE TRANI was evaluated in Emergency Department on 12/03/2018 for the symptoms described in the history of present illness. She was evaluated in the context of the global COVID-19 pandemic, which necessitated consideration that the patient might be at risk for infection with the SARS-CoV-2  virus that causes COVID-19. Institutional protocols and algorithms that pertain to the evaluation of patients at risk for COVID-19 are in a state of rapid change based on information released by regulatory bodies including the CDC and federal and state organizations. These policies and algorithms were followed during the patient's care in the ED.  Patient with ED evaluation of globus sensation to the throat after she apparently had a small Liquigel pill stuck in her throat.  Patient was able to dislodge the pill after coughing and after administration of p.o. Maalox and viscous lidocaine.  She reports at this time symptoms are completely resolved minus a little throat irritation.  She tolerated water without any subsequent choking and gagging.  Patient is discharged at this time to follow with primary provider or return to the ED as needed. ____________________________________________  FINAL CLINICAL IMPRESSION(S) / ED DIAGNOSES  Final diagnoses:  Foreign body in throat, initial encounter      Melvenia Needles, PA-C 12/03/18 2300    Nance Pear, MD 12/04/18 1601

## 2018-12-03 NOTE — Discharge Instructions (Addendum)
You have been treated for a retained pill in the throat. The pill was cleared after some oral medicines. Continue to monitor symptoms and eat soft foods as needed. Follow-up with your provider  or return if needed.

## 2018-12-03 NOTE — ED Triage Notes (Signed)
Pt to ED reporting feeling as though a pill is stuck in her throat. Pt attempted to swallow crackers but felt as though she was chocking and couldn't breath. Pt coughed and eventually coughed out a pill. Pt able speak in triage but denies having tried to eat or drink since then. Pt still has a "tickle" in her throat but reports her throat is sore as well.

## 2018-12-03 NOTE — ED Notes (Signed)
Pt able to swallow Maalox and lidocaine without difficulty or coughing .

## 2019-01-06 ENCOUNTER — Other Ambulatory Visit: Payer: Self-pay | Admitting: Student

## 2019-01-06 DIAGNOSIS — R1310 Dysphagia, unspecified: Secondary | ICD-10-CM

## 2019-01-08 ENCOUNTER — Ambulatory Visit
Admission: RE | Admit: 2019-01-08 | Discharge: 2019-01-08 | Disposition: A | Discharge status: Home / Self Care | Payer: Medicare Other | Source: Ambulatory Visit | Attending: Student | Admitting: Student

## 2019-01-08 ENCOUNTER — Other Ambulatory Visit: Payer: Self-pay

## 2019-01-08 DIAGNOSIS — R1310 Dysphagia, unspecified: Secondary | ICD-10-CM | POA: Insufficient documentation

## 2019-01-19 ENCOUNTER — Other Ambulatory Visit: Payer: Self-pay | Admitting: Otolaryngology

## 2019-01-19 DIAGNOSIS — R1310 Dysphagia, unspecified: Secondary | ICD-10-CM

## 2019-02-03 ENCOUNTER — Ambulatory Visit: Payer: Medicare Other | Admitting: Gastroenterology

## 2019-02-17 ENCOUNTER — Ambulatory Visit: Payer: Medicare Other

## 2019-02-24 ENCOUNTER — Other Ambulatory Visit: Payer: Self-pay

## 2019-02-24 ENCOUNTER — Ambulatory Visit
Admission: RE | Admit: 2019-02-24 | Discharge: 2019-02-24 | Disposition: A | Discharge status: Home / Self Care | Payer: Medicare Other | Source: Ambulatory Visit | Attending: Otolaryngology | Admitting: Otolaryngology

## 2019-02-24 DIAGNOSIS — R1310 Dysphagia, unspecified: Secondary | ICD-10-CM | POA: Insufficient documentation

## 2019-02-24 DIAGNOSIS — R1312 Dysphagia, oropharyngeal phase: Secondary | ICD-10-CM | POA: Insufficient documentation

## 2019-02-24 NOTE — Therapy (Signed)
Subjective: Patient behavior: (alertness, ability to follow instructions, etc.): Pt was pleasant and cooperative.  Chief complaint: difficulty swallowing pills   Objective:  Radiological Procedure: A videoflouroscopic evaluation of oral-preparatory, reflex initiation, and pharyngeal phases of the swallow was performed; as well as a screening of the upper esophageal phase.  I. POSTURE: upright in chair II. VIEW: lateral III. COMPENSATORY STRATEGIES: n/a IV. BOLUSES ADMINISTERED:  Thin Liquid: cup and straw  Puree: applesauce  Regular: graham cracker V. RESULTS OF EVALUATION: A. ORAL PREPARATORY PHASE: (The lips, tongue, and velum are observed for strength and coordination)       **Overall Severity Rating: WFL  B. SWALLOW INITIATION/REFLEX: (The reflex is normal if "triggered" by the time the bolus reached the base of the tongue)  **Overall Severity Rating: Timely  C. PHARYNGEAL PHASE: (Pharyngeal function is normal if the bolus shows rapid, smooth, and continuous transit through the pharynx and there is no pharyngeal residue after the swallow)  **Overall Severity Rating: Trace flash penetration seen intermittently during the swallow of thin liquids. Penetrate cleared completely and spontaneously and was not aspirated. No increase in frequency or amount when pt was challenged to take large and consecutive boluses via cup or straw.  D. LARYNGEAL PENETRATION: (Material entering into the laryngeal inlet/vestibule but not aspirated) not seen E. ASPIRATION: not seen F. ESOPHAGEAL PHASE: (Screening of the upper esophagus) WFL  ASSESSMENT: Pt presents with normal oropharyngeal swallow function. Trace flash penetration was seen intermittently during the swallow of thin liquid via cup and straw, however, this falls within functional limits for pt age. Pt was challenged to take multiple large consecutive boluses, and the frequency and amount of penetration did not significantly change. Puree and  solid textures were tolerated well. No post-swallow residue or aspiration was seen on any consistency. Pt reported difficulty with pills in July, and has since started taking meds in puree. She was continued to continue to take her medications in puree to facilitate oropharyngeal clearing. If large pills continue to be problematic, pt was encouraged to ask pharmacist if her meds could be cut in half or crushed. SLP reviewed dietary and behavioral strategies for management of esophageal dysmotility, and provided this information in written form. No further ST intervention is recommended at this time.   PLAN/RECOMMENDATIONS:  A. Diet: regular/thin  B. Swallowing Precautions: strategies for management of esophageal dysmotility provided  C. Recommended consultation to n/a  D. Therapy recommendations n/a  E. Results and recommendations were reviewed with pt following this study.  Ruperto Kiernan B. Quentin Ore, [place], [place] Speech Language Pathologist

## 2019-04-12 ENCOUNTER — Encounter (INDEPENDENT_AMBULATORY_CARE_PROVIDER_SITE_OTHER): Payer: Medicare Other | Admitting: Ophthalmology

## 2019-04-13 ENCOUNTER — Encounter (INDEPENDENT_AMBULATORY_CARE_PROVIDER_SITE_OTHER): Payer: Medicare Other | Admitting: Ophthalmology

## 2019-04-13 DIAGNOSIS — H353122 Nonexudative age-related macular degeneration, left eye, intermediate dry stage: Secondary | ICD-10-CM

## 2019-04-13 DIAGNOSIS — H35371 Puckering of macula, right eye: Secondary | ICD-10-CM | POA: Diagnosis not present

## 2019-04-13 DIAGNOSIS — H33301 Unspecified retinal break, right eye: Secondary | ICD-10-CM | POA: Diagnosis not present

## 2019-04-13 DIAGNOSIS — H338 Other retinal detachments: Secondary | ICD-10-CM | POA: Diagnosis not present

## 2019-04-13 DIAGNOSIS — H43813 Vitreous degeneration, bilateral: Secondary | ICD-10-CM

## 2019-12-15 ENCOUNTER — Other Ambulatory Visit: Payer: Self-pay | Admitting: Family Medicine

## 2019-12-15 DIAGNOSIS — R921 Mammographic calcification found on diagnostic imaging of breast: Secondary | ICD-10-CM

## 2020-04-17 ENCOUNTER — Other Ambulatory Visit: Payer: Self-pay

## 2020-04-17 ENCOUNTER — Encounter (INDEPENDENT_AMBULATORY_CARE_PROVIDER_SITE_OTHER): Payer: Medicare Other | Admitting: Ophthalmology

## 2020-04-17 DIAGNOSIS — H33301 Unspecified retinal break, right eye: Secondary | ICD-10-CM | POA: Diagnosis not present

## 2020-04-17 DIAGNOSIS — H43813 Vitreous degeneration, bilateral: Secondary | ICD-10-CM

## 2020-04-17 DIAGNOSIS — H353122 Nonexudative age-related macular degeneration, left eye, intermediate dry stage: Secondary | ICD-10-CM | POA: Diagnosis not present

## 2020-04-17 DIAGNOSIS — H35371 Puckering of macula, right eye: Secondary | ICD-10-CM

## 2020-04-17 DIAGNOSIS — H338 Other retinal detachments: Secondary | ICD-10-CM

## 2021-01-29 IMAGING — RF ESOPHAGUS/BARIUM SWALLOW/TABLET STUDY
12 series · 14 of 24 positions shown · non-contrast
Comparison: None.

CLINICAL DATA: Dysphagia

EXAM:
ESOPHOGRAM / BARIUM SWALLOW / BARIUM TABLET STUDY
TECHNIQUE: Combined double contrast and single contrast examination performed
using effervescent crystals, thick barium liquid, and thin barium
liquid. The patient was observed with fluoroscopy swallowing a 13 mm
barium sulphate tablet.
FLUOROSCOPY TIME:  Fluoroscopy Time:  2 minutes and 12 seconds
Number of Acquired Spot Images: 0

[Series 1: cp_bariatric · 0.25mm/px · 1 of 70 frames shown (1 of 12)]
[frame 2/70]
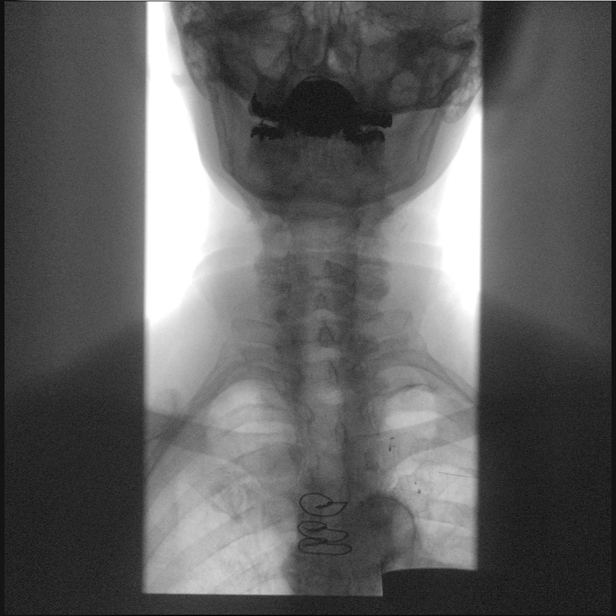

[Series 2: cp_bariatric · 0.25mm/px · 1 of 77 frames shown (2 of 12)]
[frame 6/77]
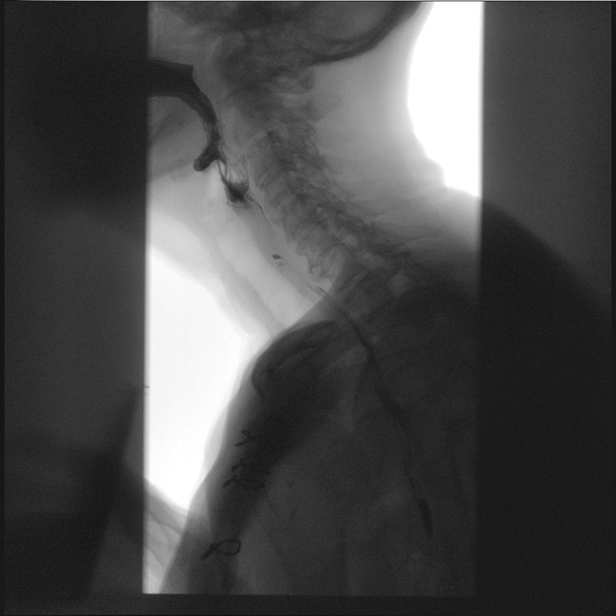

[Series 3: cp_bariatric · 0.25mm/px · 1 of 44 frames shown (3 of 12)]
[frame 7/44]
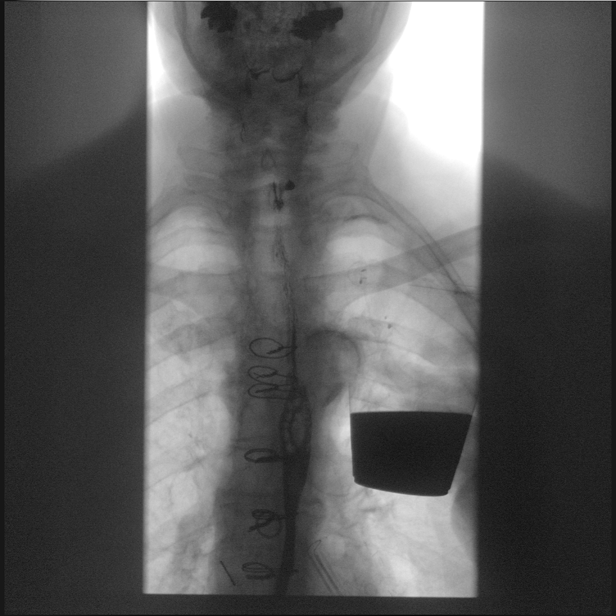

[Series 4: cp_bariatric · 0.25mm/px · 2 of 77 frames shown (4 of 12)]
[frame 1/77]
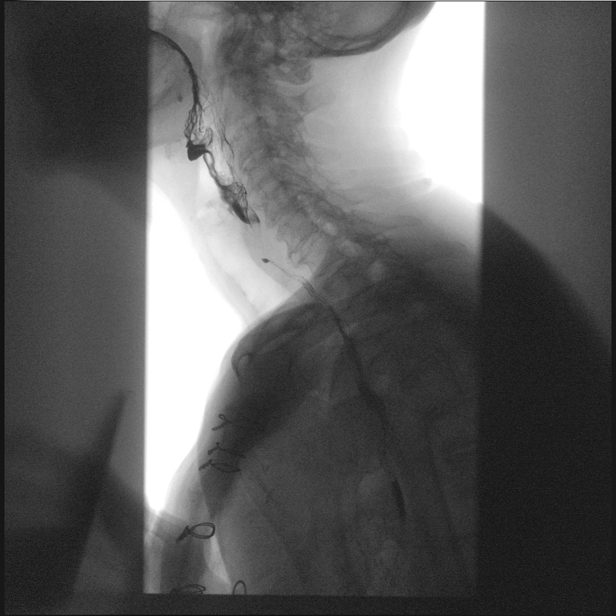
[frame 39/77]
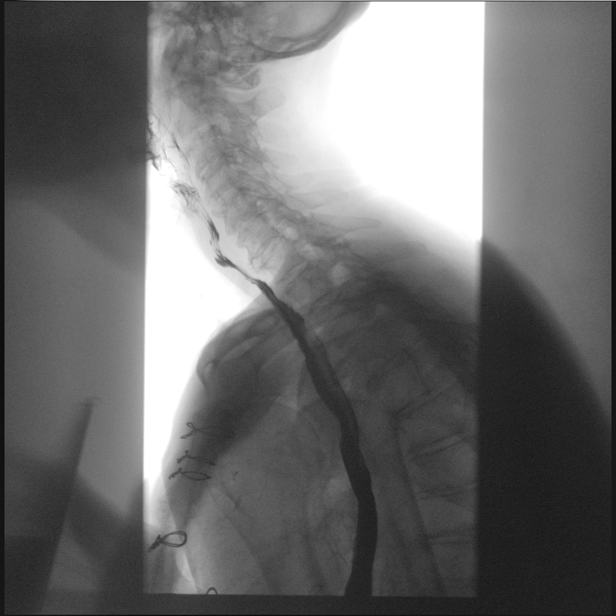

[Series 5: cp_bariatric · 0.25mm/px · 1 of 38 frames shown (5 of 12)]
[frame 20/38]
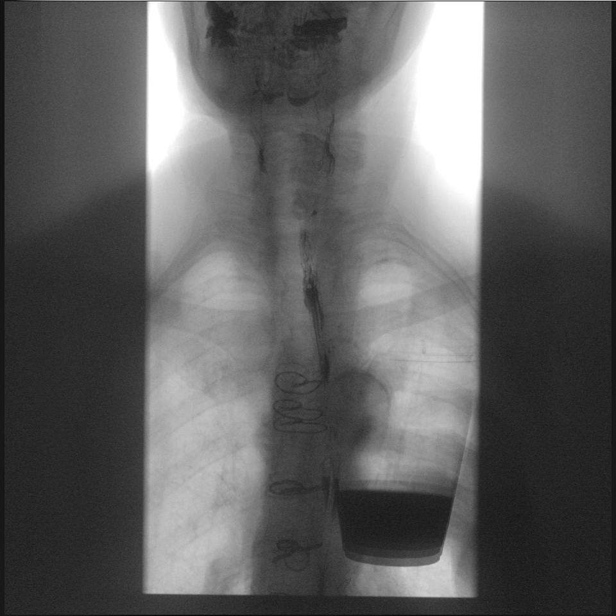

[Series 6: cp_bariatric · 0.25mm/px · 1 of 73 frames shown (6 of 12)]
[frame 37/73]
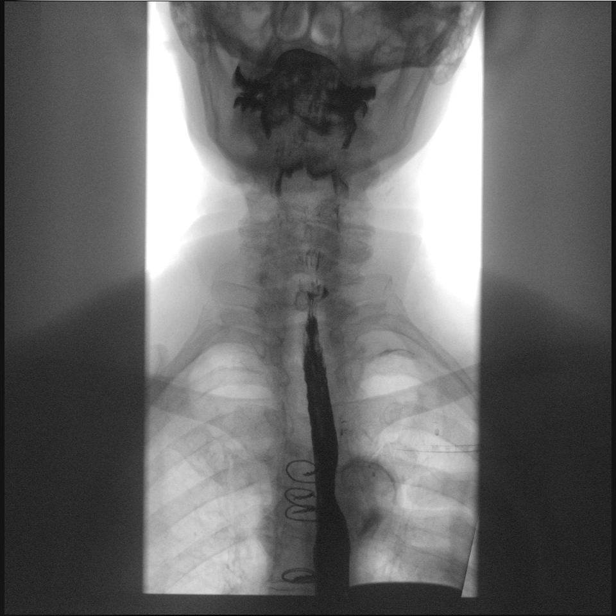

[Series 7: cp_bariatric · 0.25mm/px · 1 of 132 frames shown (7 of 12)]
[frame 67/132]
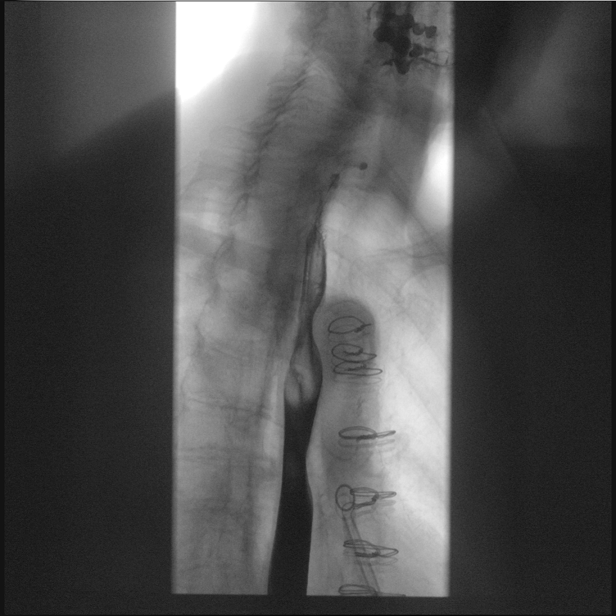

[Series 8: cp_bariatric · 0.25mm/px · 1 of 95 frames shown (8 of 12)]
[frame 31/95]
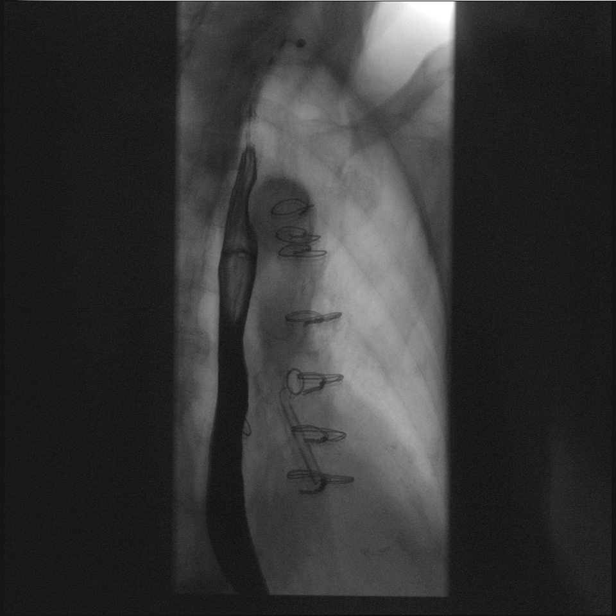

[Series 9: cp_bariatric · 0.25mm/px · 1 of 99 frames shown (9 of 12)]
[frame 15/99]
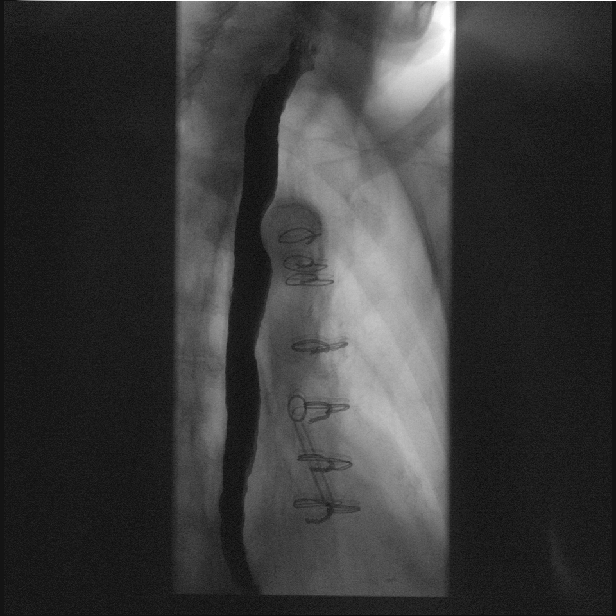

[Series 10: cp_bariatric · 0.25mm/px · 2 of 70 frames shown (10 of 12)]
[frame 36/70]
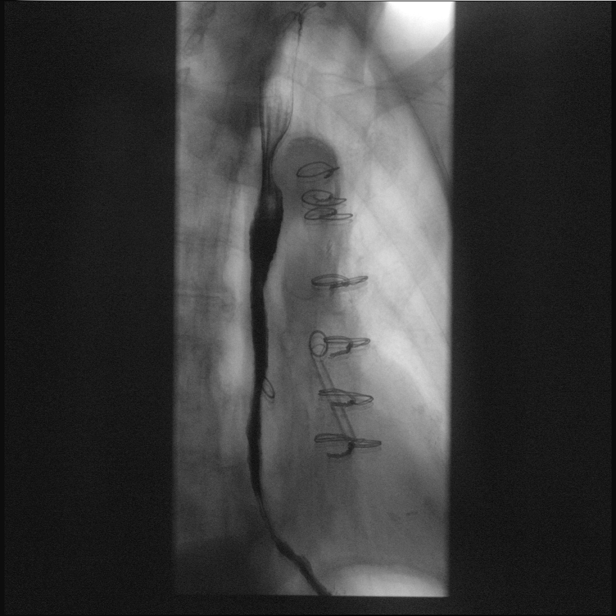
[frame 60/70]
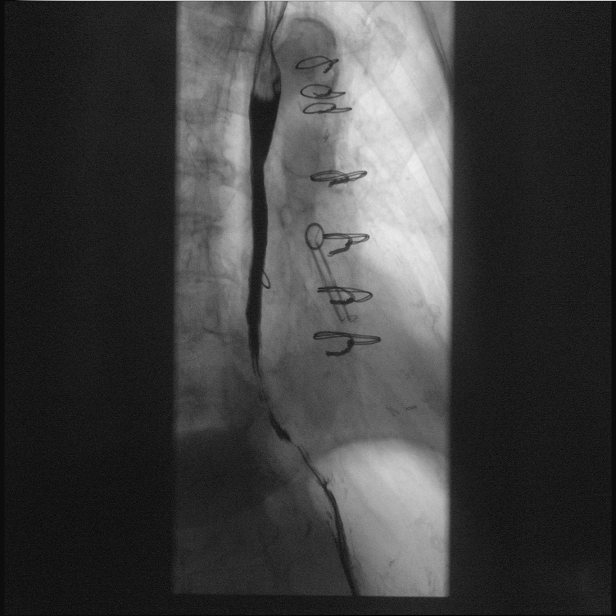

[Series 15: cp_bariatric · 0.26mm/px · 1 of 158 frames shown (11 of 12)]
[frame 143/158]
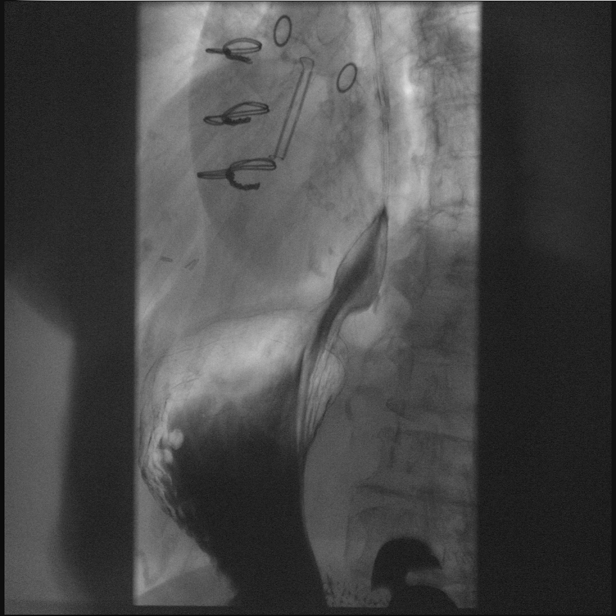

[Series 16: cp_bariatric · 0.26mm/px · 1 of 96 frames shown (12 of 12)]
[frame 87/96]
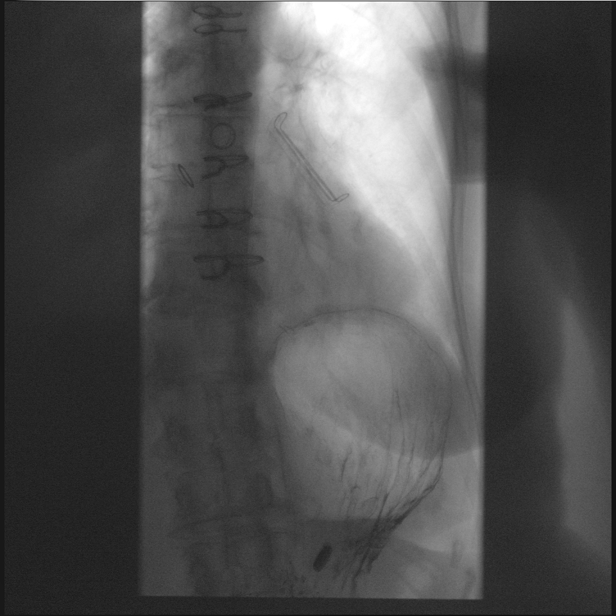

[14 of 24 positions shown; findings below may reference images not displayed]

FINDINGS: The pharynx is normal.

There is a diverticulum extending off the anterior left aspect of
the proximal cervix at the C6 level measuring 7.8 x 3.0 mm in size.
The esophagus is otherwise normal with normal peristalsis. No mass
or stricture. No mucosal abnormalities identified. The barium tablet
passed normally at the end of the study.

Limited visualization of the stomach is unremarkable.
IMPRESSION: 1. There is a 7.8 x 3.0 mm diverticulum off the cervical portion of
the esophagus. The diverticulum arises from the anterior left
lateral aspect of the esophagus at the C6 level.
2. No other abnormalities identified. The barium tablet passed
normally.

## 2021-04-17 ENCOUNTER — Encounter (INDEPENDENT_AMBULATORY_CARE_PROVIDER_SITE_OTHER): Payer: Medicare Other | Admitting: Ophthalmology

## 2021-04-17 ENCOUNTER — Other Ambulatory Visit: Payer: Self-pay

## 2021-04-17 DIAGNOSIS — H35373 Puckering of macula, bilateral: Secondary | ICD-10-CM

## 2021-04-17 DIAGNOSIS — H33303 Unspecified retinal break, bilateral: Secondary | ICD-10-CM | POA: Diagnosis not present

## 2021-04-17 DIAGNOSIS — H43813 Vitreous degeneration, bilateral: Secondary | ICD-10-CM

## 2021-04-17 DIAGNOSIS — H353122 Nonexudative age-related macular degeneration, left eye, intermediate dry stage: Secondary | ICD-10-CM

## 2021-04-17 DIAGNOSIS — H353111 Nonexudative age-related macular degeneration, right eye, early dry stage: Secondary | ICD-10-CM | POA: Diagnosis not present

## 2021-09-04 ENCOUNTER — Ambulatory Visit: Payer: Medicare Other | Admitting: Podiatry

## 2021-09-04 DIAGNOSIS — L600 Ingrowing nail: Secondary | ICD-10-CM

## 2021-09-21 NOTE — Progress Notes (Signed)
? ?  Subjective: ?Patient presents today for evaluation of pain to the lateral border of the left great toe. Patient is concerned for possible ingrown nail.  It is very sensitive to touch. patient presents today for further treatment and evaluation. ? ?Past Medical History:  ?Diagnosis Date  ? Arthritis   ? Detached retina   ? Fibrocystic breast disease   ? GERD (gastroesophageal reflux disease)   ? Hyperlipidemia   ? Macular degeneration   ? Osteoporosis   ? ? ?Objective:  ?General: Well developed, nourished, in no acute distress, alert and oriented x3  ? ?Dermatology: Skin is warm, dry and supple bilateral.  Lateral border left great toe appears to be erythematous with evidence of an ingrowing nail.  Tenderness on palpation noted to the border of the nail fold. The remaining nails appear unremarkable at this time. There are no open sores, lesions. ? ?Vascular: Dorsalis Pedis artery and Posterior Tibial artery pedal pulses palpable. No lower extremity edema noted.  ? ?Neruologic: Grossly intact via light touch bilateral. ? ?Musculoskeletal: No pedal deformity noted ? ?Assesement: ?#1 Paronychia with ingrowing nail lateral border left great toe ?#2 Pain in toe ? ?Plan of Care:  ?1. Patient evaluated.  ?2. Discussed treatment alternatives and plan of care. Explained nail avulsion procedure and post procedure course to patient as well as conservative care which would simply be debridement of the offending border of the nail plate. ?3. Patient opted for conservative treatment today.  Mechanical debridement of the lateral border of the left hallux nail plate was performed using a nail nipper without incident.  Patient felt significant relief ?4.  Return to clinic as needed ? ?Felecia Shelling, DPM ?[facility] ? ?Dr. Felecia Shelling, DPM  ?  ?[address]                                       ?[place], [place] [address]                ?Office [phone]  ?Fax [phone] ? ? ? ? ?

## 2022-04-19 ENCOUNTER — Encounter (INDEPENDENT_AMBULATORY_CARE_PROVIDER_SITE_OTHER): Payer: Medicare Other | Admitting: Ophthalmology

## 2022-04-19 DIAGNOSIS — H353122 Nonexudative age-related macular degeneration, left eye, intermediate dry stage: Secondary | ICD-10-CM | POA: Diagnosis not present

## 2022-04-19 DIAGNOSIS — H338 Other retinal detachments: Secondary | ICD-10-CM

## 2022-04-19 DIAGNOSIS — H35371 Puckering of macula, right eye: Secondary | ICD-10-CM | POA: Diagnosis not present

## 2022-04-19 DIAGNOSIS — H33301 Unspecified retinal break, right eye: Secondary | ICD-10-CM | POA: Diagnosis not present

## 2022-04-19 DIAGNOSIS — H43813 Vitreous degeneration, bilateral: Secondary | ICD-10-CM | POA: Diagnosis not present

## 2022-04-22 ENCOUNTER — Encounter (INDEPENDENT_AMBULATORY_CARE_PROVIDER_SITE_OTHER): Payer: Medicare Other | Admitting: Ophthalmology

## 2022-04-22 DIAGNOSIS — H35371 Puckering of macula, right eye: Secondary | ICD-10-CM | POA: Diagnosis not present

## 2022-04-22 DIAGNOSIS — H27132 Posterior dislocation of lens, left eye: Secondary | ICD-10-CM

## 2022-04-22 DIAGNOSIS — H43813 Vitreous degeneration, bilateral: Secondary | ICD-10-CM

## 2022-04-22 DIAGNOSIS — H33301 Unspecified retinal break, right eye: Secondary | ICD-10-CM

## 2022-04-22 DIAGNOSIS — H338 Other retinal detachments: Secondary | ICD-10-CM | POA: Diagnosis not present

## 2022-04-22 NOTE — H&P (Signed)
Darlene Barry is an 88 y.o. female.   Chief Complaint:sudden vision loss yesterday left eye HPI: Had cataract surgery 32 yrs ago.  Suddenly lost vision yesterday.  Now has totally dislocated intraocular lens left eye.  The lens in in the vitreous cavity.   Past Medical History:  Diagnosis Date   Arthritis    Detached retina    Fibrocystic breast disease    GERD (gastroesophageal reflux disease)    Hyperlipidemia    Macular degeneration    Osteoporosis     Past Surgical History:  Procedure Laterality Date   BREAST EXCISIONAL BIOPSY Right 1970   NEG   CATARACT EXTRACTION, BILATERAL     HIP SURGERY     fractured hip, has screws in place   LEFT HEART CATH AND CORONARY ANGIOGRAPHY Left 01/14/2018   Procedure: LEFT HEART CATH AND CORONARY ANGIOGRAPHY;  Surgeon: [place] Blinks, MD;  Location: [facility] INVASIVE CV LAB;  Service: Cardiovascular;  Laterality: Left;   TOTAL HIP ARTHROPLASTY Left 07/02/2016   Procedure: LEFT TOTAL HIP ARTHROPLASTY ANTERIOR APPROACH;  Surgeon: Durene Romans, MD;  Location: [facility] ORS;  Service: Orthopedics;  Laterality: Left;    Family History  Problem Relation Age of Onset   Breast cancer Daughter 22   Breast cancer Paternal Aunt    Breast cancer Paternal Aunt    Social History:  reports that she has never smoked. She has never used smokeless tobacco. She reports that she does not drink alcohol and does not use drugs.  Allergies:  Allergies  Allergen Reactions   Amoxicillin Nausea And Vomiting and Other (See Comments)    Has patient had a PCN reaction causing immediate rash, facial/tongue/throat swelling, SOB or lightheadedness with hypotension: No Has patient had a PCN reaction causing severe rash involving mucus membranes or skin necrosis: No Has patient had a PCN reaction that required hospitalization: No Has patient had a PCN reaction occurring within the last 10 years: Yes If all of the above answers are "NO", then may proceed with Cephalosporin use.     Sulfa Antibiotics Other (See Comments)    Unknown    No medications prior to admission.    Review of systems otherwise negative  There were no vitals taken for this visit.  Physical exam: Mental status: oriented x3. Eyes: See eye exam associated with this date of surgery in media tab.  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    Darlene Barry 04/22/2022, 4:40 PM

## 2022-05-06 MED ORDER — OXYCODONE HCL 5 MG PO TABS
ORAL_TABLET | ORAL | Status: AC
  Filled 2022-05-06: qty 1

## 2022-05-06 NOTE — Anesthesia Preprocedure Evaluation (Signed)
Anesthesia Evaluation  Patient identified by MRN, date of birth, ID band Patient awake    Reviewed: Allergy & Precautions, NPO status , Patient's Chart, lab work & pertinent test results, reviewed documented beta blocker date and time   Airway Mallampati: II  TM Distance: >3 FB Neck ROM: Full    Dental  (+) Dental Advisory Given,    Pulmonary neg pulmonary ROS   Pulmonary exam normal breath sounds clear to auscultation       Cardiovascular + angina  + CAD and + CABG (2019)  Normal cardiovascular exam Rhythm:Regular Rate:Normal  Cath 2019 (prior to CABG) Prox Cx to Mid Cx lesion is 75% stenosed. Prox Cx lesion is 55% stenosed. Ost LAD lesion is 40% stenosed. Prox LAD-1 lesion is 90% stenosed. Prox LAD-2 lesion is 75% stenosed. Prox LAD-3 lesion is 70% stenosed. Prox RCA lesion is 70% stenosed. Prox RCA to Mid RCA lesion is 65% stenosed. Dist RCA lesion is 100% stenosed.    Neuro/Psych negative neurological ROS  negative psych ROS   GI/Hepatic Neg liver ROS,GERD  Medicated,,  Endo/Other  negative endocrine ROS    Renal/GU negative Renal ROS  negative genitourinary   Musculoskeletal negative musculoskeletal ROS (+) Arthritis ,    Abdominal   Peds  Hematology negative hematology ROS (+)   Anesthesia Other Findings   Reproductive/Obstetrics                             Anesthesia Physical Anesthesia Plan  ASA: 3  Anesthesia Plan: General   Post-op Pain Management: Minimal or no pain anticipated   Induction: Intravenous  PONV Risk Score and Plan: 3 and Dexamethasone, Ondansetron and Treatment may vary due to age or medical condition  Airway Management Planned: Oral ETT  Additional Equipment:   Intra-op Plan:   Post-operative Plan: Extubation in OR  Informed Consent: I have reviewed the patients History and Physical, chart, labs and discussed the procedure including  the risks, benefits and alternatives for the proposed anesthesia with the patient or authorized representative who has indicated his/her understanding and acceptance.     Dental advisory given  Plan Discussed with: CRNA  Anesthesia Plan Comments:        Anesthesia Quick Evaluation

## 2022-05-06 NOTE — Progress Notes (Addendum)
I called Darlene Barry, Darlene Barry answered the phone, Darlene Barry was out and would be back around, I asked to have Darlene Barry.  Darlene Barry called approximately  1715, I was out of the office, Darlene Barry left me a message, telling me her name and date of birth.  Darlene Barry stated, but, I probably will not be available to speak to me , because she will not be available until after 1930.  I called Darlene Barry back and left instructions,  I instructed Darlene Barry to arrive to the hospital at the time Dr. Ashley Barry instructed her to arrive.  I instructed Darlene. Barry to take the medications that Dr. Ashley Barry told her to take. I instructed Darlene Barry to not eat after midnight after midnight, only drink enough water to swallow medications. I instructed Darlene Barry to shower with antibacteria soap, dry off with a clean towel, do not apply lotions, powders or colognes, and no deodorant.  I instructed Darlene Barry to remove jewelry and piercing's, brush teeth and wear clean, comfortable clothes.  Dr. Zannie Kehr, cardiologist, cleared Darlene Barry for surgery

## 2022-05-07 ENCOUNTER — Other Ambulatory Visit: Payer: Self-pay

## 2022-05-07 ENCOUNTER — Encounter (HOSPITAL_COMMUNITY)
Admission: RE | Disposition: A | Discharge status: Home / Self Care | Payer: Self-pay | Source: Ambulatory Visit | Attending: Ophthalmology

## 2022-05-07 ENCOUNTER — Ambulatory Visit (HOSPITAL_COMMUNITY)
Admission: RE | Admit: 2022-05-07 | Discharge: 2022-05-08 | Disposition: A | Discharge status: Home / Self Care | Payer: Medicare Other | Source: Ambulatory Visit | Attending: Ophthalmology | Admitting: Ophthalmology

## 2022-05-07 ENCOUNTER — Encounter (HOSPITAL_COMMUNITY): Payer: Self-pay | Admitting: Ophthalmology

## 2022-05-07 ENCOUNTER — Ambulatory Visit (HOSPITAL_BASED_OUTPATIENT_CLINIC_OR_DEPARTMENT_OTHER): Payer: Medicare Other | Admitting: Anesthesiology

## 2022-05-07 ENCOUNTER — Ambulatory Visit (HOSPITAL_COMMUNITY): Payer: Medicare Other | Admitting: Anesthesiology

## 2022-05-07 DIAGNOSIS — T8522XA Displacement of intraocular lens, initial encounter: Secondary | ICD-10-CM | POA: Insufficient documentation

## 2022-05-07 DIAGNOSIS — I25119 Atherosclerotic heart disease of native coronary artery with unspecified angina pectoris: Secondary | ICD-10-CM

## 2022-05-07 DIAGNOSIS — H2710 Unspecified dislocation of lens: Secondary | ICD-10-CM | POA: Diagnosis not present

## 2022-05-07 DIAGNOSIS — H27132 Posterior dislocation of lens, left eye: Secondary | ICD-10-CM

## 2022-05-07 DIAGNOSIS — M1990 Unspecified osteoarthritis, unspecified site: Secondary | ICD-10-CM | POA: Diagnosis not present

## 2022-05-07 DIAGNOSIS — Y772 Prosthetic and other implants, materials and accessory ophthalmic devices associated with adverse incidents: Secondary | ICD-10-CM | POA: Diagnosis not present

## 2022-05-07 DIAGNOSIS — Z951 Presence of aortocoronary bypass graft: Secondary | ICD-10-CM | POA: Diagnosis not present

## 2022-05-07 HISTORY — DX: Atherosclerotic heart disease of native coronary artery without angina pectoris: I25.10

## 2022-05-07 HISTORY — PX: 25 GAUGE PARS PLANA VITRECTOMY WITH 20 GAUGE MVR PORT: SHX6041

## 2022-05-07 HISTORY — PX: INTRAOCULAR LENS REMOVAL: SHX5339

## 2022-05-07 HISTORY — PX: LASER PHOTO ABLATION: SHX5942

## 2022-05-07 HISTORY — PX: PLACEMENT AND SUTURE OF SECONDARY INTRAOCULAR LENS: SHX5338

## 2022-05-07 LAB — CBC
HCT: 38.5 % (ref 36.0–46.0)
Hemoglobin: 13.1 g/dL (ref 12.0–15.0)
MCH: 30.5 pg (ref 26.0–34.0)
MCHC: 34 g/dL (ref 30.0–36.0)
MCV: 89.5 fL (ref 80.0–100.0)
Platelets: 218 10*3/uL (ref 150–400)
RBC: 4.3 MIL/uL (ref 3.87–5.11)
RDW: 12.6 % (ref 11.5–15.5)
WBC: 5.4 10*3/uL (ref 4.0–10.5)
nRBC: 0 % (ref 0.0–0.2)

## 2022-05-07 LAB — BASIC METABOLIC PANEL
Anion gap: 6 (ref 5–15)
BUN: 15 mg/dL (ref 8–23)
CO2: 22 mmol/L (ref 22–32)
Calcium: 9.2 mg/dL (ref 8.9–10.3)
Chloride: 111 mmol/L (ref 98–111)
Creatinine, Ser: 1.03 mg/dL — ABNORMAL HIGH (ref 0.44–1.00)
GFR, Estimated: 52 mL/min — ABNORMAL LOW (ref >60)
Glucose, Bld: 107 mg/dL — ABNORMAL HIGH (ref 70–99)
Potassium: 4.2 mmol/L (ref 3.5–5.1)
Sodium: 139 mmol/L (ref 135–145)

## 2022-05-07 SURGERY — 25 GAUGE PARS PLANA VITRECTOMY WITH 20 GAUGE MVR PORT
Anesthesia: General | Site: Eye | Laterality: Left

## 2022-05-07 MED ORDER — TEMAZEPAM 15 MG PO CAPS: 15.0000 mg | ORAL_CAPSULE | Freq: Every evening | ORAL | Status: DC | PRN

## 2022-05-07 MED ORDER — BSS PLUS IO SOLN
INTRAOCULAR | Status: AC
  Filled 2022-05-07: qty 500

## 2022-05-07 MED ORDER — FENTANYL CITRATE (PF) 100 MCG/2ML IJ SOLN
INTRAMUSCULAR | Status: AC
  Filled 2022-05-07: qty 2

## 2022-05-07 MED ORDER — ROCURONIUM BROMIDE 10 MG/ML (PF) SYRINGE
PREFILLED_SYRINGE | INTRAVENOUS | Status: AC
  Filled 2022-05-07: qty 10

## 2022-05-07 MED ORDER — CYCLOPENTOLATE HCL 1 % OP SOLN
1.0000 [drp] | OPHTHALMIC | Status: AC | PRN
  Administered 2022-05-07 (×3): 1 [drp] via OPHTHALMIC
  Filled 2022-05-07: qty 2

## 2022-05-07 MED ORDER — BUPIVACAINE HCL (PF) 0.75 % IJ SOLN
INTRAMUSCULAR | Status: DC | PRN
  Administered 2022-05-07: 10 mL

## 2022-05-07 MED ORDER — MORPHINE SULFATE (PF) 2 MG/ML IV SOLN: 1.0000 mg | INTRAVENOUS | Status: DC | PRN

## 2022-05-07 MED ORDER — BACITRACIN-POLYMYXIN B 500-10000 UNIT/GM OP OINT
1.0000 | TOPICAL_OINTMENT | Freq: Three times a day (TID) | OPHTHALMIC | Status: DC
  Filled 2022-05-07: qty 3.5

## 2022-05-07 MED ORDER — PROPOFOL 500 MG/50ML IV EMUL
INTRAVENOUS | Status: DC | PRN
  Administered 2022-05-07: 20 ug/kg/min via INTRAVENOUS

## 2022-05-07 MED ORDER — LIDOCAINE HCL 2 % IJ SOLN
INTRAMUSCULAR | Status: AC
  Filled 2022-05-07: qty 20

## 2022-05-07 MED ORDER — PHENYLEPHRINE HCL-NACL 20-0.9 MG/250ML-% IV SOLN
INTRAVENOUS | Status: DC | PRN
  Administered 2022-05-07: 50 ug/min via INTRAVENOUS

## 2022-05-07 MED ORDER — SENNOSIDES-DOCUSATE SODIUM 8.6-50 MG PO TABS
1.0000 | ORAL_TABLET | Freq: Every day | ORAL | Status: DC
  Administered 2022-05-07: 1 via ORAL
  Filled 2022-05-07: qty 1

## 2022-05-07 MED ORDER — HYDROCODONE-ACETAMINOPHEN 5-325 MG PO TABS: 1.0000 | ORAL_TABLET | ORAL | Status: DC | PRN

## 2022-05-07 MED ORDER — METOPROLOL SUCCINATE ER 25 MG PO TB24
ORAL_TABLET | ORAL | Status: AC
  Administered 2022-05-07: 25 mg via ORAL
  Filled 2022-05-07: qty 1

## 2022-05-07 MED ORDER — ORAL CARE MOUTH RINSE: 15.0000 mL | Freq: Once | OROMUCOSAL | Status: AC

## 2022-05-07 MED ORDER — SENNA-DOCUSATE SODIUM 8.6-50 MG PO TABS
1.0000 | ORAL_TABLET | Freq: Every day | ORAL | Status: DC
  Filled 2022-05-07: qty 1

## 2022-05-07 MED ORDER — POLYMYXIN B SULFATE 500000 UNITS IJ SOLR
INTRAMUSCULAR | Status: AC
  Filled 2022-05-07: qty 10

## 2022-05-07 MED ORDER — GATIFLOXACIN 0.5 % OP SOLN
1.0000 [drp] | Freq: Four times a day (QID) | OPHTHALMIC | Status: DC
  Filled 2022-05-07: qty 2.5

## 2022-05-07 MED ORDER — LIDOCAINE 2% (20 MG/ML) 5 ML SYRINGE
INTRAMUSCULAR | Status: AC
  Filled 2022-05-07: qty 5

## 2022-05-07 MED ORDER — PROPOFOL 10 MG/ML IV BOLUS
INTRAVENOUS | Status: DC | PRN
  Administered 2022-05-07: 90 mg via INTRAVENOUS

## 2022-05-07 MED ORDER — VANCOMYCIN HCL IN DEXTROSE 1-5 GM/200ML-% IV SOLN
1000.0000 mg | Freq: Once | INTRAVENOUS | Status: AC
  Administered 2022-05-07: 1000 mg via INTRAVENOUS
  Filled 2022-05-07: qty 200

## 2022-05-07 MED ORDER — SODIUM HYALURONATE 10 MG/ML IO SOLUTION
PREFILLED_SYRINGE | INTRAOCULAR | Status: AC
  Filled 2022-05-07: qty 0.85

## 2022-05-07 MED ORDER — SODIUM CHLORIDE (PF) 0.9 % IJ SOLN
INTRAMUSCULAR | Status: AC
  Filled 2022-05-07: qty 10

## 2022-05-07 MED ORDER — ROCURONIUM BROMIDE 10 MG/ML (PF) SYRINGE
PREFILLED_SYRINGE | INTRAVENOUS | Status: DC | PRN
  Administered 2022-05-07: 60 mg via INTRAVENOUS

## 2022-05-07 MED ORDER — ACETAMINOPHEN 325 MG PO TABS
325.0000 mg | ORAL_TABLET | ORAL | Status: DC | PRN
  Administered 2022-05-07: 650 mg via ORAL
  Filled 2022-05-07: qty 2

## 2022-05-07 MED ORDER — DEXAMETHASONE SODIUM PHOSPHATE 10 MG/ML IJ SOLN
INTRAMUSCULAR | Status: AC
  Filled 2022-05-07: qty 1

## 2022-05-07 MED ORDER — BRIMONIDINE TARTRATE 0.15 % OP SOLN
1.0000 [drp] | Freq: Once | OPHTHALMIC | Status: AC
  Administered 2022-05-07: 1 [drp] via OPHTHALMIC
  Filled 2022-05-07: qty 5

## 2022-05-07 MED ORDER — METOPROLOL SUCCINATE ER 25 MG PO TB24: 25.0000 mg | ORAL_TABLET | Freq: Once | ORAL | Status: AC

## 2022-05-07 MED ORDER — BSS IO SOLN
INTRAOCULAR | Status: DC | PRN
  Administered 2022-05-07: 15 mL via INTRAOCULAR

## 2022-05-07 MED ORDER — DEXAMETHASONE SODIUM PHOSPHATE 10 MG/ML IJ SOLN
INTRAMUSCULAR | Status: DC | PRN
  Administered 2022-05-07: 10 mg

## 2022-05-07 MED ORDER — STERILE WATER FOR INJECTION IJ SOLN
INTRAMUSCULAR | Status: AC
  Filled 2022-05-07: qty 20

## 2022-05-07 MED ORDER — MECLIZINE HCL 12.5 MG PO TABS: 12.5000 mg | ORAL_TABLET | Freq: Four times a day (QID) | ORAL | Status: DC | PRN

## 2022-05-07 MED ORDER — EPINEPHRINE PF 1 MG/ML IJ SOLN: INTRAOCULAR | Status: DC | PRN

## 2022-05-07 MED ORDER — TRIAMCINOLONE ACETONIDE 40 MG/ML IJ SUSP
INTRAMUSCULAR | Status: AC
  Filled 2022-05-07: qty 5

## 2022-05-07 MED ORDER — ATORVASTATIN CALCIUM 80 MG PO TABS
80.0000 mg | ORAL_TABLET | Freq: Every day | ORAL | Status: DC
  Administered 2022-05-07: 80 mg via ORAL
  Filled 2022-05-07: qty 1

## 2022-05-07 MED ORDER — METOPROLOL SUCCINATE ER 25 MG PO TB24
25.0000 mg | ORAL_TABLET | Freq: Every day | ORAL | Status: DC
  Administered 2022-05-07: 25 mg via ORAL
  Filled 2022-05-07: qty 1

## 2022-05-07 MED ORDER — MAGNESIUM HYDROXIDE 400 MG/5ML PO SUSP: 15.0000 mL | Freq: Four times a day (QID) | ORAL | Status: DC | PRN

## 2022-05-07 MED ORDER — ACETAZOLAMIDE SODIUM 500 MG IJ SOLR
500.0000 mg | Freq: Once | INTRAMUSCULAR | Status: AC
  Administered 2022-05-08: 500 mg via INTRAVENOUS
  Filled 2022-05-07: qty 500

## 2022-05-07 MED ORDER — GATIFLOXACIN 0.5 % OP SOLN
1.0000 [drp] | OPHTHALMIC | Status: AC | PRN
  Administered 2022-05-07 (×3): 1 [drp] via OPHTHALMIC
  Filled 2022-05-07: qty 2.5

## 2022-05-07 MED ORDER — LIDOCAINE 2% (20 MG/ML) 5 ML SYRINGE
INTRAMUSCULAR | Status: DC | PRN
  Administered 2022-05-07: 50 mg via INTRAVENOUS

## 2022-05-07 MED ORDER — TROPICAMIDE 1 % OP SOLN
1.0000 [drp] | OPHTHALMIC | Status: AC | PRN
  Administered 2022-05-07 (×3): 1 [drp] via OPHTHALMIC
  Filled 2022-05-07: qty 15

## 2022-05-07 MED ORDER — BSS IO SOLN
INTRAOCULAR | Status: AC
  Filled 2022-05-07: qty 15

## 2022-05-07 MED ORDER — CEFTAZIDIME 1 G IJ SOLR
INTRAMUSCULAR | Status: AC
  Filled 2022-05-07: qty 1

## 2022-05-07 MED ORDER — STERILE WATER FOR INJECTION IJ SOLN
INTRAMUSCULAR | Status: DC | PRN
  Administered 2022-05-07: 20 mL

## 2022-05-07 MED ORDER — PANTOPRAZOLE SODIUM 40 MG PO TBEC
40.0000 mg | DELAYED_RELEASE_TABLET | Freq: Every day | ORAL | Status: DC
  Administered 2022-05-07: 40 mg via ORAL
  Filled 2022-05-07: qty 1

## 2022-05-07 MED ORDER — PREDNISOLONE ACETATE 1 % OP SUSP
1.0000 [drp] | Freq: Four times a day (QID) | OPHTHALMIC | Status: DC
  Filled 2022-05-07: qty 5

## 2022-05-07 MED ORDER — CHLORHEXIDINE GLUCONATE 0.12 % MT SOLN
15.0000 mL | Freq: Once | OROMUCOSAL | Status: AC
  Administered 2022-05-07: 15 mL via OROMUCOSAL
  Filled 2022-05-07: qty 15

## 2022-05-07 MED ORDER — SODIUM HYALURONATE 10 MG/ML IO SOLUTION
PREFILLED_SYRINGE | INTRAOCULAR | Status: DC | PRN
  Administered 2022-05-07: .85 mL via INTRAOCULAR

## 2022-05-07 MED ORDER — LATANOPROST 0.005 % OP SOLN
1.0000 [drp] | Freq: Every day | OPHTHALMIC | Status: DC
  Filled 2022-05-07: qty 2.5

## 2022-05-07 MED ORDER — EPINEPHRINE PF 1 MG/ML IJ SOLN
INTRAMUSCULAR | Status: AC
  Filled 2022-05-07: qty 1

## 2022-05-07 MED ORDER — ONDANSETRON HCL 4 MG/2ML IJ SOLN
INTRAMUSCULAR | Status: DC | PRN
  Administered 2022-05-07: 4 mg via INTRAVENOUS

## 2022-05-07 MED ORDER — DORZOLAMIDE HCL 2 % OP SOLN
1.0000 [drp] | Freq: Three times a day (TID) | OPHTHALMIC | Status: DC
  Filled 2022-05-07: qty 10

## 2022-05-07 MED ORDER — PHENYLEPHRINE HCL 2.5 % OP SOLN
1.0000 [drp] | OPHTHALMIC | Status: AC | PRN
  Administered 2022-05-07 (×3): 1 [drp] via OPHTHALMIC
  Filled 2022-05-07: qty 2

## 2022-05-07 MED ORDER — ACETAZOLAMIDE SODIUM 500 MG IJ SOLR
INTRAMUSCULAR | Status: AC
  Filled 2022-05-07: qty 500

## 2022-05-07 MED ORDER — SODIUM CHLORIDE 0.45 % IV SOLN: INTRAVENOUS | Status: DC

## 2022-05-07 MED ORDER — ONDANSETRON HCL 4 MG/2ML IJ SOLN
4.0000 mg | Freq: Four times a day (QID) | INTRAMUSCULAR | Status: DC
  Administered 2022-05-07 – 2022-05-08 (×2): 4 mg via INTRAVENOUS
  Filled 2022-05-07 (×2): qty 2

## 2022-05-07 MED ORDER — ATROPINE SULFATE 1 % OP SOLN
OPHTHALMIC | Status: AC
  Filled 2022-05-07: qty 5

## 2022-05-07 MED ORDER — BACITRACIN-POLYMYXIN B 500-10000 UNIT/GM OP OINT
TOPICAL_OINTMENT | OPHTHALMIC | Status: AC
  Filled 2022-05-07: qty 3.5

## 2022-05-07 MED ORDER — SUGAMMADEX SODIUM 200 MG/2ML IV SOLN
INTRAVENOUS | Status: DC | PRN
  Administered 2022-05-07: 200 mg via INTRAVENOUS

## 2022-05-07 MED ORDER — SODIUM CHLORIDE 0.9 % IV SOLN: INTRAVENOUS | Status: DC

## 2022-05-07 MED ORDER — BACITRACIN-POLYMYXIN B 500-10000 UNIT/GM OP OINT
TOPICAL_OINTMENT | OPHTHALMIC | Status: DC | PRN
  Administered 2022-05-07: 1 via OPHTHALMIC

## 2022-05-07 MED ORDER — TETRACAINE HCL 0.5 % OP SOLN
2.0000 [drp] | Freq: Once | OPHTHALMIC | Status: DC
  Filled 2022-05-07: qty 4

## 2022-05-07 MED ORDER — BRIMONIDINE TARTRATE 0.2 % OP SOLN
1.0000 [drp] | Freq: Two times a day (BID) | OPHTHALMIC | Status: DC
  Filled 2022-05-07: qty 5

## 2022-05-07 MED ORDER — BUPIVACAINE HCL (PF) 0.75 % IJ SOLN
INTRAMUSCULAR | Status: AC
  Filled 2022-05-07: qty 10

## 2022-05-07 MED ORDER — FENTANYL CITRATE (PF) 100 MCG/2ML IJ SOLN
INTRAMUSCULAR | Status: DC | PRN
  Administered 2022-05-07 (×2): 50 ug via INTRAVENOUS

## 2022-05-07 MED ORDER — ONDANSETRON HCL 4 MG/2ML IJ SOLN
INTRAMUSCULAR | Status: AC
  Filled 2022-05-07: qty 2

## 2022-05-07 SURGICAL SUPPLY — 73 items
APL SRG 3 HI ABS STRL LF PLS (MISCELLANEOUS) ×2
APPLICATOR DR MATTHEWS STRL (MISCELLANEOUS) IMPLANT
BALL CTTN LRG ABS STRL LF (GAUZE/BANDAGES/DRESSINGS) ×6
BAND WRIST GAS GREEN (MISCELLANEOUS) IMPLANT
BLADE EYE CATARACT 19 1.4 BEAV (BLADE) IMPLANT
BLADE MVR KNIFE 19G (BLADE) IMPLANT
BLADE MVR KNIFE 20G (BLADE) IMPLANT
CANNULA DUAL BORE 23G (CANNULA) IMPLANT
CANNULA DUALBORE 25G (CANNULA) ×2 IMPLANT
CANNULA VLV SOFT TIP 25G (OPHTHALMIC) ×2 IMPLANT
CANNULA VLV SOFT TIP 25GA (OPHTHALMIC) ×2 IMPLANT
COTTONBALL LRG STERILE PKG (GAUZE/BANDAGES/DRESSINGS) ×6 IMPLANT
COVER MAYO STAND STRL (DRAPES) IMPLANT
DRAPE OPHTHALMIC 77X100 STRL (CUSTOM PROCEDURE TRAY) ×2 IMPLANT
EAGLE VIT/RET MICRO PIC 168 25 (MISCELLANEOUS) ×2 IMPLANT
FILTER BLUE MILLIPORE (MISCELLANEOUS) IMPLANT
FILTER STRAW FLUID ASPIR (MISCELLANEOUS) IMPLANT
FORCEPS ECKARDT ILM 25G SERR (OPHTHALMIC RELATED) IMPLANT
FORCEPS GRIESHABER ILM 25G A (INSTRUMENTS) IMPLANT
GAS WRIST BAND GREEN (MISCELLANEOUS)
GLOVE ECLIPSE 8.5 STRL (GLOVE) ×2 IMPLANT
GLOVE SS BIOGEL STRL SZ 6.5 (GLOVE) ×2 IMPLANT
GLOVE SS BIOGEL STRL SZ 7 (GLOVE) ×2 IMPLANT
GLOVE TRIUMPH SURG SIZE 8.5 (KITS) ×2 IMPLANT
GOWN STRL REUS W/ TWL LRG LVL3 (GOWN DISPOSABLE) ×6 IMPLANT
GOWN STRL REUS W/TWL LRG LVL3 (GOWN DISPOSABLE) ×6
HANDLE PNEUMATIC FOR CONSTEL (OPHTHALMIC) IMPLANT
IMPL LENS CZ60BD 17.0 (Intraocular Lens) IMPLANT
IMPLANT LENS CZ60BD 17.0 (Intraocular Lens) ×2 IMPLANT
KIT BASIN OR (CUSTOM PROCEDURE TRAY) ×2 IMPLANT
KIT TURNOVER KIT B (KITS) ×2 IMPLANT
MICROPICK 25G (MISCELLANEOUS) ×2
NDL 18GX1X1/2 (RX/OR ONLY) (NEEDLE) ×2 IMPLANT
NDL 25GX 5/8IN NON SAFETY (NEEDLE) ×2 IMPLANT
NDL FILTER BLUNT 18X1 1/2 (NEEDLE) ×2 IMPLANT
NDL HYPO 30X.5 LL (NEEDLE) ×4 IMPLANT
NDL PRECISIONGLIDE 27X1.5 (NEEDLE) IMPLANT
NEEDLE 18GX1X1/2 (RX/OR ONLY) (NEEDLE) ×2 IMPLANT
NEEDLE 25GX 5/8IN NON SAFETY (NEEDLE) ×2 IMPLANT
NEEDLE FILTER BLUNT 18X1 1/2 (NEEDLE) ×2 IMPLANT
NEEDLE HYPO 30X.5 LL (NEEDLE) ×4 IMPLANT
NEEDLE PRECISIONGLIDE 27X1.5 (NEEDLE) IMPLANT
NS IRRIG 1000ML POUR BTL (IV SOLUTION) ×2 IMPLANT
PACK VITRECTOMY CUSTOM (CUSTOM PROCEDURE TRAY) ×2 IMPLANT
PAD ARMBOARD 7.5X6 YLW CONV (MISCELLANEOUS) ×4 IMPLANT
PAK PIK VITRECTOMY CVS 25GA (OPHTHALMIC) ×2 IMPLANT
PENCIL BIPOLAR 25GA STR DISP (OPHTHALMIC RELATED) IMPLANT
PIC ILLUMINATED 25G (OPHTHALMIC) ×2
PICK MICROPICK 25G (MISCELLANEOUS) ×2 IMPLANT
PIK ILLUMINATED 25G (OPHTHALMIC) ×2 IMPLANT
PROBE LASER ILLUM FLEX CVD 25G (OPHTHALMIC) ×2 IMPLANT
REPL STRA BRUSH NDL (NEEDLE) IMPLANT
REPL STRA BRUSH NEEDLE (NEEDLE) IMPLANT
RESERVOIR BACK FLUSH (MISCELLANEOUS) IMPLANT
ROLLS DENTAL (MISCELLANEOUS) ×4 IMPLANT
SCRAPER DIAMOND 25GA (OPHTHALMIC RELATED) IMPLANT
SCRAPER DIAMOND DUST MEMBRANE (MISCELLANEOUS) ×2 IMPLANT
SHIELD EYE LENSE ONLY DISP (GAUZE/BANDAGES/DRESSINGS) IMPLANT
SPONGE SURGIFOAM ABS GEL 12-7 (HEMOSTASIS) ×2 IMPLANT
STOPCOCK 4 WAY LG BORE MALE ST (IV SETS) IMPLANT
SUT CHROMIC 7 0 TG140 8 (SUTURE) IMPLANT
SUT ETHILON 10 0 CS140 6 (SUTURE) IMPLANT
SUT ETHILON 9 0 TG140 8 (SUTURE) ×2 IMPLANT
SUT POLY NON ABSORB 10-0 8 STR (SUTURE) IMPLANT
SUT SILK 4 0 RB 1 (SUTURE) IMPLANT
SYR 10ML LL (SYRINGE) IMPLANT
SYR 20ML LL LF (SYRINGE) ×2 IMPLANT
SYR BULB EAR ULCER 3OZ GRN STR (SYRINGE) ×2 IMPLANT
SYR TB 1ML LUER SLIP (SYRINGE) ×2 IMPLANT
TOWEL GREEN STERILE FF (TOWEL DISPOSABLE) ×2 IMPLANT
TUBING HIGH PRESS EXTEN 6IN (TUBING) IMPLANT
WATER STERILE IRR 1000ML POUR (IV SOLUTION) ×2 IMPLANT
WIPE INSTRUMENT VISIWIPE 73X73 (MISCELLANEOUS) ×2 IMPLANT

## 2022-05-07 NOTE — Anesthesia Procedure Notes (Signed)
Procedure Name: Intubation Date/Time: 05/07/2022 11:41 AM  Performed by: Pearson Grippe, CRNAPre-anesthesia Checklist: Patient identified, Emergency Drugs available, Suction available and Patient being monitored Patient Re-evaluated:Patient Re-evaluated prior to induction Oxygen Delivery Method: Circle system utilized Preoxygenation: Pre-oxygenation with 100% oxygen Induction Type: IV induction Ventilation: Mask ventilation without difficulty Laryngoscope Size: Miller and 2 Grade View: Grade I Tube type: Oral Tube size: 7.0 mm Number of attempts: 1 Airway Equipment and Method: Stylet Placement Confirmation: ETT inserted through vocal cords under direct vision, positive ETCO2 and breath sounds checked- equal and bilateral Secured at: 21 cm Tube secured with: Tape Dental Injury: Teeth and Oropharynx as per pre-operative assessment

## 2022-05-07 NOTE — Brief Op Note (Signed)
Brief Operative note   Preoperative diagnosis:  Dislocated Intra Ocular Lens left eye Postoperative diagnosis  * No Diagnosis Codes entered *  Procedures: Pars plana vitrectomy, laser, removal of dislocated Starr lens IOL from the vitreous, placement of secondary intraocular lens with suture, gas fluid exchange, left eye  Surgeon:  [name], MD...  Assistant:  Rosalie Doctor SA   Anesthesia: General  Specimen: none  Estimated blood loss:  1cc  Complications: none  Patient sent to PACU in good condition  Composed by [name] MD  Dictation number: [id number]

## 2022-05-07 NOTE — Progress Notes (Signed)
Patient arrived to 6 north room 14. Alert and oriented x4. Pain level 2/10. Patient ambulated from wheelchair to bed with minimal assist from staff. Bed in lowest position. Call light in reach. Will continue to monitor pt.

## 2022-05-07 NOTE — Transfer of Care (Signed)
Immediate Anesthesia Transfer of Care Note  Patient: Darlene Barry  Procedure(s) Performed: 25 GAUGE PARS PLANA VITRECTOMY WITH 20 GAUGE MVR PORT (Left) REMOVAL OF INTRAOCULAR LENS (Left: Eye) PLACEMENT AND SUTURE OF SECONDARY INTRAOCULAR LENS (Left: Eye) LASER PHOTO ABLATION (Left: Eye)  Patient Location: PACU  Anesthesia Type:General  Level of Consciousness: awake, alert , and oriented  Airway & Oxygen Therapy: Patient Spontanous Breathing and Patient connected to face mask oxygen  Post-op Assessment: Report given to RN and Post -op Vital signs reviewed and stable  Post vital signs: Reviewed and stable  Last Vitals:  Vitals Value Taken Time  BP 152/93 05/07/22 1426  Temp    Pulse 84 05/07/22 1428  Resp 17 05/07/22 1428  SpO2 100 % 05/07/22 1428  Vitals shown include unvalidated device data.  Last Pain:  Vitals:   05/07/22 0929  TempSrc:   PainSc: 0-No pain         Complications: No notable events documented.

## 2022-05-07 NOTE — Anesthesia Postprocedure Evaluation (Signed)
Anesthesia Post Note  Patient: Darlene Barry  Procedure(s) Performed: 25 GAUGE PARS PLANA VITRECTOMY WITH 20 GAUGE MVR PORT (Left) REMOVAL OF INTRAOCULAR LENS (Left: Eye) PLACEMENT AND SUTURE OF SECONDARY INTRAOCULAR LENS (Left: Eye) LASER PHOTO ABLATION (Left: Eye)     Patient location during evaluation: PACU Anesthesia Type: General Level of consciousness: awake and alert Pain management: pain level controlled Vital Signs Assessment: post-procedure vital signs reviewed and stable Respiratory status: spontaneous breathing, nonlabored ventilation and respiratory function stable Cardiovascular status: stable and blood pressure returned to baseline Anesthetic complications: no   No notable events documented.  Last Vitals:  Vitals:   05/07/22 1545 05/07/22 1603  BP: 136/71 125/73  Pulse: 76 70  Resp: 17 17  Temp: 36.7 C 36.6 C  SpO2: 96% 98%    Last Pain:  Vitals:   05/07/22 1604  TempSrc:   PainSc: 2                  Beryle Lathe

## 2022-05-07 NOTE — Op Note (Unsigned)
NAME: ANAJA, MONTS MEDICAL RECORD NO: [id number] ACCOUNT NO: [id number] DATE OF BIRTH: April 13, 1932 FACILITY: [facility] LOCATION: [facility] PHYSICIAN: Beulah Gandy. Ashley Royalty, MD  Operative Report   DATE OF PROCEDURE: 05/07/2022  ADMISSION DIAGNOSIS:  Dislocated intraocular lens into the vitreous, left eye.  PROCEDURE PERFORMED:  Pars plana vitrectomy, retina photocoagulation, removal of intraocular lens from vitreous, placement of secondary intraocular lens with suture, all in the left eye.  SURGEON:  Beulah Gandy. Ashley Royalty, MD.  ASSISTANTHali Marry Teschner.  ANESTHESIA:  General.  DESCRIPTION OF PROCEDURE:  Usual prep and drape.  Attention was carried to the pars plana where a conjunctival peritomy was performed from 8 o'clock around to 4 o'clock.  Half thickness scleral flaps were raised at 3 and 9 o'clock in anticipation of IOL  suture.  A 3 layered corneal scleral wound was created from 10 o'clock to 2 o'clock.  25 gauge trocars were placed at 10 o'clock, 2 o'clock and 4 o'clock with infusion at 4 o'clock.  Provisc was placed on the corneal surface.  The BIOM viewing system was  moved into place.  The pars plana vitrectomy was performed with the lighted pick and the cutter.  The vitrectomy was carried posteriorly and the STARR intraocular lens was seen lying on the macular region with the capsule intact.  The cutter was drawn  down to the implant and the implant was engaged with the vitreous cutter.  The capsule came off in one single piece and was removed with the vitreous cutter.  Vitrectomy was carried out in a core fashion.  Then, the vitrectomy was carried out into the  mid and far periphery with the BIOM viewing system.  The intraocular lens was engaged with the vitreous cutter and the 25 gauge lighted pick was used to spear a hole in the edge of the lens.  The lens was then brought up into the pupillary axis.  The  lights were turned on and the keratome was used to open the corneal scleral wound.  The  lens was passed from the vitreous into the anterior chamber and then out through the corneal scleral wound.  The lens was inspected and sent for gross pathologic  identification.  The attention was then carried to the pars plana area where #2 Prolene sutures were placed beneath the scleral flaps posterior to the iris and in the ciliary sulcus from 3 o'clock to 9 o'clock.  Once these sutures were in place the  internal sutures were drawn out through the corneoscleral wound. A new intraocular lens was brought onto the field, made by [employer].  Model CZ70BD power 17D, length 12.5 mm, optic 7.0 mm, serial #[id number].  The lens was inspected and  cleaned.  The Prolene sutures were attached to the eyelets of the lens and the lens was placed into the anterior chamber, then into the posterior chamber then into the ciliary sulcus.  The Prolene sutures were drawn securely externally as the implant was  dialed into place.  The Prolene sutures were knotted securely beneath the scleral flaps.  The cornea was closed with interrupted 10-0 nylon sutures.  The wounds were tested and found to be secure.  Provisc was placed again on the cornea and additional  vitrectomy was carried out.  All vitreous was clear and all capsular remnants were removed.  The indirect ophthalmoscope laser was moved into place.  A 494 burns were placed on the posterior aspect of the scleral buckle for 360 degrees.  The power  was  between 300 and 400 milliwatts, 1000 microns each and 0.1 seconds each.  A 70% gas fluid exchange was performed.  The instruments and trocars were removed from the eye.  The wounds were tested and found to be secure.  The pressure was measured and found  to be 10 with a Barraquer tonometer.  The conjunctiva was closed with 6-0 chromic.  The corneal scleral wound was covered.  Polymyxin and ceftazidime were rinsed around the globe for antibiotic coverage.  Marcaine was injected around the globe for   postoperative pain.  Polysporin ophthalmic ointment was placed.  Decadron 10 mg was injected into the lower subconjunctival space.  Polysporin ophthalmic ointment a patch and shield were placed.  The patient was awakened and taken to recovery in  satisfactory condition.   PUS D: 05/07/2022 2:22:00 pm T: 05/07/2022 3:17:00 pm  JOB: [id number]/ [id number]

## 2022-05-08 ENCOUNTER — Other Ambulatory Visit (HOSPITAL_COMMUNITY): Payer: Self-pay

## 2022-05-08 ENCOUNTER — Encounter (HOSPITAL_COMMUNITY): Payer: Self-pay | Admitting: Ophthalmology

## 2022-05-08 DIAGNOSIS — T8522XA Displacement of intraocular lens, initial encounter: Secondary | ICD-10-CM | POA: Diagnosis not present

## 2022-05-08 LAB — SURGICAL PATHOLOGY

## 2022-05-08 MED ORDER — PREDNISOLONE ACETATE 1 % OP SUSP: 1.0000 [drp] | Freq: Four times a day (QID) | OPHTHALMIC | 0 refills | Status: AC

## 2022-05-08 MED ORDER — STERILE WATER FOR INJECTION IJ SOLN
INTRAMUSCULAR | Status: AC
  Administered 2022-05-08: 10 mL
  Filled 2022-05-08: qty 10

## 2022-05-08 MED ORDER — GATIFLOXACIN 0.5 % OP SOLN: 1.0000 [drp] | Freq: Four times a day (QID) | OPHTHALMIC | Status: AC

## 2022-05-08 MED ORDER — BACITRACIN-POLYMYXIN B 500-10000 UNIT/GM OP OINT: 1.0000 | TOPICAL_OINTMENT | Freq: Three times a day (TID) | OPHTHALMIC | 0 refills | Status: AC

## 2022-05-08 NOTE — Progress Notes (Signed)
IV taken out, vitals stable, and all AM eye drops and meds given by Dr. Ashley Royalty himself.  Pt given d/c instructions with full understanding and taken to car with daughter.

## 2022-05-08 NOTE — Progress Notes (Signed)
05/08/2022, 6:41 AM  Mental Status:  Awake, Alert, Oriented  Anterior segment: Cornea  Clear    Anterior Chamber Clear    Lens:   IOL, in place   Intra Ocular Pressure 20 mmHg with Tonopen  Vitreous: Clear 35%gas bubble   Retina:  Attached Good laser reaction   Impression: Excellent result Retina attached   Final Diagnosis: Principal Problem:   Dislocated IOL (intraocular lens), posterior, left   Plan: start post operative eye drops.  Discharge to home.  Give post operative instructions  [name] 05/08/2022, 6:41 AM

## 2022-05-10 ENCOUNTER — Encounter (INDEPENDENT_AMBULATORY_CARE_PROVIDER_SITE_OTHER): Payer: Medicare Other | Admitting: Ophthalmology

## 2022-05-10 DIAGNOSIS — Z961 Presence of intraocular lens: Secondary | ICD-10-CM

## 2022-05-31 ENCOUNTER — Encounter (INDEPENDENT_AMBULATORY_CARE_PROVIDER_SITE_OTHER): Payer: Medicare Other | Admitting: Ophthalmology

## 2022-05-31 DIAGNOSIS — Z961 Presence of intraocular lens: Secondary | ICD-10-CM

## 2022-06-13 ENCOUNTER — Encounter (INDEPENDENT_AMBULATORY_CARE_PROVIDER_SITE_OTHER): Payer: Medicare Other | Admitting: Ophthalmology

## 2022-06-13 DIAGNOSIS — Z961 Presence of intraocular lens: Secondary | ICD-10-CM

## 2022-08-09 ENCOUNTER — Encounter (INDEPENDENT_AMBULATORY_CARE_PROVIDER_SITE_OTHER): Payer: Medicare Other | Admitting: Ophthalmology

## 2022-09-05 ENCOUNTER — Encounter (INDEPENDENT_AMBULATORY_CARE_PROVIDER_SITE_OTHER): Payer: Medicare Other | Admitting: Ophthalmology

## 2022-09-05 DIAGNOSIS — H33301 Unspecified retinal break, right eye: Secondary | ICD-10-CM | POA: Diagnosis not present

## 2022-09-05 DIAGNOSIS — H353122 Nonexudative age-related macular degeneration, left eye, intermediate dry stage: Secondary | ICD-10-CM | POA: Diagnosis not present

## 2022-09-05 DIAGNOSIS — Z961 Presence of intraocular lens: Secondary | ICD-10-CM

## 2022-09-05 DIAGNOSIS — H338 Other retinal detachments: Secondary | ICD-10-CM

## 2022-09-05 DIAGNOSIS — H43811 Vitreous degeneration, right eye: Secondary | ICD-10-CM | POA: Diagnosis not present

## 2023-04-28 ENCOUNTER — Encounter (INDEPENDENT_AMBULATORY_CARE_PROVIDER_SITE_OTHER): Payer: Medicare Other | Admitting: Ophthalmology

## 2023-08-29 ENCOUNTER — Encounter (INDEPENDENT_AMBULATORY_CARE_PROVIDER_SITE_OTHER): Payer: Medicare Other | Admitting: Ophthalmology

## 2023-09-09 ENCOUNTER — Other Ambulatory Visit: Payer: Self-pay | Admitting: Internal Medicine

## 2023-09-09 DIAGNOSIS — I2089 Other forms of angina pectoris: Secondary | ICD-10-CM

## 2023-09-09 DIAGNOSIS — I2510 Atherosclerotic heart disease of native coronary artery without angina pectoris: Secondary | ICD-10-CM

## 2023-09-19 ENCOUNTER — Encounter (INDEPENDENT_AMBULATORY_CARE_PROVIDER_SITE_OTHER): Admitting: Ophthalmology

## 2023-09-19 DIAGNOSIS — H35371 Puckering of macula, right eye: Secondary | ICD-10-CM

## 2023-09-19 DIAGNOSIS — H338 Other retinal detachments: Secondary | ICD-10-CM | POA: Diagnosis not present

## 2023-09-19 DIAGNOSIS — H353122 Nonexudative age-related macular degeneration, left eye, intermediate dry stage: Secondary | ICD-10-CM | POA: Diagnosis not present

## 2023-09-19 DIAGNOSIS — H33301 Unspecified retinal break, right eye: Secondary | ICD-10-CM

## 2023-09-19 DIAGNOSIS — H43811 Vitreous degeneration, right eye: Secondary | ICD-10-CM | POA: Diagnosis not present

## 2023-09-23 ENCOUNTER — Ambulatory Visit
Admission: RE | Admit: 2023-09-23 | Discharge: 2023-09-23 | Disposition: A | Discharge status: Home / Self Care | Source: Ambulatory Visit | Attending: Internal Medicine | Admitting: Internal Medicine

## 2023-09-23 DIAGNOSIS — I2510 Atherosclerotic heart disease of native coronary artery without angina pectoris: Secondary | ICD-10-CM | POA: Diagnosis present

## 2023-09-23 DIAGNOSIS — I2089 Other forms of angina pectoris: Secondary | ICD-10-CM | POA: Diagnosis present

## 2023-09-23 LAB — NM MYOCAR MULTI W/SPECT W/WALL MOTION / EF
Base ST Depression (mm): 0 mm
Estimated workload: 1
Exercise duration (min): 1 min
Exercise duration (sec): 0 s
LV dias vol: 41 mL (ref 46–106)
LV sys vol: 13 mL
MPHR: 130 {beats}/min
Nuc Stress EF: 68 %
Peak HR: 101 {beats}/min
Percent HR: 77 %
Rest HR: 64 {beats}/min
Rest Nuclear Isotope Dose: 10.1 mCi
SDS: 11
SRS: 1
SSS: 12
ST Depression (mm): 0 mm
Stress Nuclear Isotope Dose: 32.7 mCi
TID: 0.93

## 2023-09-23 MED ORDER — TECHNETIUM TC 99M TETROFOSMIN IV KIT
30.0000 | PACK | Freq: Once | INTRAVENOUS | Status: AC | PRN
  Administered 2023-09-23: 32.72 via INTRAVENOUS

## 2023-09-23 MED ORDER — TECHNETIUM TC 99M TETROFOSMIN IV KIT
10.0700 | PACK | Freq: Once | INTRAVENOUS | Status: AC | PRN
  Administered 2023-09-23: 10.07 via INTRAVENOUS

## 2023-09-23 MED ORDER — REGADENOSON 0.4 MG/5ML IV SOLN
0.4000 mg | Freq: Once | INTRAVENOUS | Status: AC
  Administered 2023-09-23: 0.4 mg via INTRAVENOUS
  Filled 2023-09-23: qty 5

## 2023-11-19 ENCOUNTER — Emergency Department

## 2023-11-19 ENCOUNTER — Other Ambulatory Visit: Payer: Self-pay

## 2023-11-19 ENCOUNTER — Emergency Department
Admission: EM | Admit: 2023-11-19 | Discharge: 2023-11-19 | Disposition: A | Discharge status: Home / Self Care | Attending: Emergency Medicine | Admitting: Emergency Medicine

## 2023-11-19 DIAGNOSIS — Z7982 Long term (current) use of aspirin: Secondary | ICD-10-CM | POA: Insufficient documentation

## 2023-11-19 DIAGNOSIS — S92352A Displaced fracture of fifth metatarsal bone, left foot, initial encounter for closed fracture: Secondary | ICD-10-CM | POA: Diagnosis not present

## 2023-11-19 DIAGNOSIS — Z96643 Presence of artificial hip joint, bilateral: Secondary | ICD-10-CM | POA: Diagnosis not present

## 2023-11-19 DIAGNOSIS — Z955 Presence of coronary angioplasty implant and graft: Secondary | ICD-10-CM | POA: Diagnosis not present

## 2023-11-19 DIAGNOSIS — M7989 Other specified soft tissue disorders: Secondary | ICD-10-CM | POA: Insufficient documentation

## 2023-11-19 DIAGNOSIS — S80211A Abrasion, right knee, initial encounter: Secondary | ICD-10-CM | POA: Diagnosis not present

## 2023-11-19 DIAGNOSIS — S52501A Unspecified fracture of the lower end of right radius, initial encounter for closed fracture: Secondary | ICD-10-CM | POA: Diagnosis not present

## 2023-11-19 DIAGNOSIS — W19XXXA Unspecified fall, initial encounter: Secondary | ICD-10-CM | POA: Diagnosis not present

## 2023-11-19 DIAGNOSIS — Y92009 Unspecified place in unspecified non-institutional (private) residence as the place of occurrence of the external cause: Secondary | ICD-10-CM | POA: Diagnosis not present

## 2023-11-19 DIAGNOSIS — S022XXA Fracture of nasal bones, initial encounter for closed fracture: Secondary | ICD-10-CM | POA: Insufficient documentation

## 2023-11-19 DIAGNOSIS — S99922A Unspecified injury of left foot, initial encounter: Secondary | ICD-10-CM | POA: Diagnosis present

## 2023-11-19 DIAGNOSIS — S0121XA Laceration without foreign body of nose, initial encounter: Secondary | ICD-10-CM

## 2023-11-19 NOTE — Discharge Instructions (Addendum)
 You have been seen in the Emergency Department (ED) today for a fall.  Your work up does not show any concerning injuries.  Please take over-the-counter ibuprofen and/or Tylenol  as needed for your pain (unless you have an allergy or your doctor as told you not to take them), or take any prescribed medication as instructed.  Please follow up with your doctor regarding today's Emergency Department (ED) visit and your recent fall.    Return to the ED if you have any headache, confusion, slurred speech, weakness/numbness of any arm or leg, or any increased pain.   Please read through the included information about splint care (keep it clean and dry).  If your pain becomes much more severe, the splint becomes too tight, or you feel as if your injured limb is becoming numb or cold, please return immediately to the Emergency Department.  Follow up with the orthopedics specialist listed in this paperwork.  When possible, keep your splint elevated to help with the swelling.  You may also use ice packs over the splint.

## 2023-11-19 NOTE — ED Triage Notes (Addendum)
 Arrives POV after a mechanical fall at home ~645PM tonight.   Says she stepped off a step in the garage.   C/o right wrist pain, left foot pain. Abrasions to right knee, left shin, and neck tenderness. ~1.5cm cut to nose where she landed on her glasses.   Ambulatory to triage. Denies any LOC, or anticoagulants.

## 2023-11-19 NOTE — ED Provider Notes (Signed)
 [facility] Provider Note    Event Date/Time   First MD Initiated Contact with Patient 11/19/23 2200     (approximate)   History   Fall   HPI  Darlene Barry is a 89 y.o. female  with a past medical history of bilateral hip arthroplasty, angina decubitus, hyperlipidemia, carotid stenosis bilaterally, osteoporosis, CABG, presents to the emergency department following a fall that occurred earlier today.  States she was stepping off of her last step in the garage when she fell forward onto her face.  Also reports right wrist and left foot pain as well as abrasions to her right knee, left shin and cut on her nose.  Denies any loss of consciousness, nausea, vomiting, blurry vision or vision changes, numbness, tingling, chest pain, abdominal pain.  Patient does take aspirin  81 mg by mouth daily.  Patient does take care of her husband with Alzheimer's at home by herself.  She is ambulatory without any assistance.  Has family in [place], but no one closer by.      Physical Exam   Triage Vital Signs: ED Triage Vitals [11/19/23 2040]  Encounter Vitals Group     BP (!) 150/79     Girls Systolic BP Percentile      Girls Diastolic BP Percentile      Boys Systolic BP Percentile      Boys Diastolic BP Percentile      Pulse Rate 84     Resp 18     Temp 98.1 F (36.7 C)     Temp src      SpO2 100 %     Weight 128 lb (58.1 kg)     Height 5' 3 (1.6 m)     Head Circumference      Peak Flow      Pain Score 4     Pain Loc      Pain Education      Exclude from Growth Chart     Most recent vital signs: Vitals:   11/19/23 2336 11/19/23 2351  BP: (!) 175/86 (!) 175/86  Pulse:  84  Resp:  16  Temp:  98.1 F (36.7 C)  SpO2:  100%    General: Awake, in no acute distress. Appears stated age. Head: Normocephalic, atraumatic. Eyes: PERRLA. EOMs intact. No scleral icterus or conjunctival injection. Ears/Nose/Throat: TMs intact b/l. Nares patent, no nasal discharge.  Oropharynx moist, no erythema or exudate. Dentition intact. Neck: Supple, no lymphadenopathy, no JVD, no nuchal rigidity. CV: Regular rate, 84 bpm. Peripheral pulses 2+ and symmetric. No edema. Respiratory: No respiratory distress. Normal respiratory effort. GI: Soft, non-distended.  MSK: Normal ROM and  5/5 strength in left upper extremity; 4/5 strength in right upper extremity.  Tender to palpation along the distal radius on the right side. Skin:Warm, dry, intact.  Ecchymosis noted to anterior portion of 4th and 5th metatarsals of the left foot.  Abrasions to right knee, left shin.  1.5 cm laceration noted to the bridge of her nose. Neurological: A&Ox4 to person, place, time, and situation. Cranial nerves II-XII intact. Sensation intact. Strength symmetric. No focal deficits. Psychiatric: Mood and affect appropriate. Thought processes coherent.   ED Results / Procedures / Treatments   Labs (all labs ordered are listed, but only abnormal results are displayed) Labs Reviewed - No data to display   EKG     RADIOLOGY Images of her right wrist, left foot, and CTs of the maxillofacial, cervical spine, and head  ordered.  Right wrist IMPRESSION: Comminuted and mildly displaced distal radial fracture with radiocarpal intra-articular extension.  Left foot IMPRESSION: Oblique mildly displaced distal fifth metatarsal shaft fracture.  CT findings IMPRESSION: 1. No acute intracranial abnormality. 2. Question minimally displaced left nasal bone fracture. 3. No acute fracture in the cervical spine.  PROCEDURES:  Critical Care performed: No   .Laceration Repair  Date/Time: 11/20/2023 12:20 AM  Performed by: Sheron Salm, PA-C Authorized by: Sheron Salm, PA-C   Consent:    Consent obtained:  Verbal   Consent given by:  Patient   Risks, benefits, and alternatives were discussed: yes     Risks discussed:  Infection, need for additional repair, nerve damage, poor wound healing,  poor cosmetic result, pain, retained foreign body, tendon damage and vascular damage Universal protocol:    Procedure explained and questions answered to patient or proxy's satisfaction: yes     Immediately prior to procedure, a time out was called: yes     Patient identity confirmed:  Verbally with patient Anesthesia:    Anesthesia method:  None Laceration details:    Location: nose.   Length (cm):  1.5 Exploration:    Hemostasis achieved with:  Direct pressure   Imaging outcome: foreign body not noted     Wound exploration: entire depth of wound visualized     Wound extent: areolar tissue not violated, fascia not violated, no foreign body, no signs of injury, no nerve damage, no tendon damage, no underlying fracture and no vascular damage   Treatment:    Area cleansed with:  Povidone-iodine   Amount of cleaning:  Standard   Irrigation solution:  Sterile saline   Irrigation volume:  20 mL   Irrigation method:  Syringe Skin repair:    Repair method:  Tissue adhesive Approximation:    Approximation:  Close Repair type:    Repair type:  Simple Post-procedure details:    Dressing:  Adhesive bandage   Procedure completion:  Tolerated well, no immediate complications Comments:     Wound repaired with Dermabond.    MEDICATIONS ORDERED IN ED: Medications - No data to display   IMPRESSION / MDM / ASSESSMENT AND PLAN / ED COURSE  I reviewed the triage vital signs and the nursing notes.                              Differential diagnosis includes, but is not limited to, mechanical fall, nasal bone fracture, nose laceration, fifth metatarsal bone fracture, distal radial fracture  Patient's presentation is most consistent with acute complicated illness / injury requiring diagnostic workup.  Patient is a 89 year old female who presented today following a fall.  Patient had laceration of her nose that was easily addressed with Dermabond, please see procedure note for full details.   Discussed wound care instructions with her.  Abrasions were cleaned by the nurse followed by placing Band-Aids on them.  Patient's right wrist pain resulted in x-ray which showed comminuted and mildly displaced distal radial fracture.  Patient was placed in a sugar-tong splint and sling.  Patient left foot pain resulted in x-ray which showed oblique mildly displaced distal fifth metatarsal shaft fracture.  Patient was placed in a postop shoe.  Patient was given follow-up information for orthopedics.  There were some concerns regarding whether she would be able to take care of herself and her husband with Alzheimer's at home while wearing the splint and postop shoe.  Patient  is ambulatory without any assistance, feels like she will be able to take care of herself and her husband.  Patient does have a caretaker with her husband at this time and reports she can contact them to provide care for him while she is healing from her fractures at this time.  She told me if she has trouble at home with doing any of her ADLs and taking care of her husband simultaneously that she will return to the emergency department to get in touch with a social worker regarding need for short-term rehab and/or home health care.  Patient was given the opportunity to ask questions; all questions were answered. Emergency department return precautions were discussed with the patient.  Patient is in agreement to the treatment plan.  Patient is stable for discharge.    FINAL CLINICAL IMPRESSION(S) / ED DIAGNOSES   Final diagnoses:  Fall, initial encounter  Closed fracture of nasal bone, initial encounter  Laceration of nose, initial encounter  Displaced fracture of fifth metatarsal bone, left foot, initial encounter for closed fracture  Closed fracture of distal end of right radius, unspecified fracture morphology, initial encounter     Rx / DC Orders   ED Discharge Orders     None        Note:  This document was prepared  using Dragon voice recognition software and may include unintentional dictation errors.     Sheron Salm, PA-C 11/20/23 GLORIANNE Dorothyann Drivers, MD 11/22/23 2249

## 2024-01-08 ENCOUNTER — Ambulatory Visit: Attending: Neurology | Admitting: Physical Therapy

## 2024-01-08 ENCOUNTER — Other Ambulatory Visit: Payer: Self-pay

## 2024-01-08 DIAGNOSIS — R2681 Unsteadiness on feet: Secondary | ICD-10-CM | POA: Insufficient documentation

## 2024-01-08 DIAGNOSIS — R2689 Other abnormalities of gait and mobility: Secondary | ICD-10-CM | POA: Diagnosis present

## 2024-01-08 NOTE — Therapy (Unsigned)
 OUTPATIENT PHYSICAL THERAPY NEURO EVALUATION   Patient Name: Darlene Barry MRN: [id number] DOB:1931/08/18, 89 y.o., female Today's Date: 01/09/2024   PCP: Valora Agent, MD  REFERRING PROVIDER: Maree Jannett POUR, MD   END OF SESSION:  PT End of Session - 01/08/24 1006     Visit Number 1    Number of Visits 8    Date for PT Re-Evaluation 03/05/24    PT Start Time 1015    PT Stop Time 1100    PT Time Calculation (min) 45 min    Equipment Utilized During Treatment Gait belt    Activity Tolerance Patient tolerated treatment well    Behavior During Therapy [facility] for tasks assessed/performed          Past Medical History:  Diagnosis Date   Arthritis    Coronary artery disease    CABG 01/2018   Detached retina    Fibrocystic breast disease    GERD (gastroesophageal reflux disease)    Hyperlipidemia    Macular degeneration    Osteoporosis    Past Surgical History:  Procedure Laterality Date   25 GAUGE PARS PLANA VITRECTOMY WITH 20 GAUGE MVR PORT Left 05/07/2022   Procedure: 25 GAUGE PARS PLANA VITRECTOMY WITH 20 GAUGE MVR PORT;  Surgeon: Alvia Norleen BIRCH, MD;  Location: [facility] OR;  Service: Ophthalmology;  Laterality: Left;   BREAST EXCISIONAL BIOPSY Right 1970   NEG   CATARACT EXTRACTION, BILATERAL     CORONARY ARTERY BYPASS GRAFT     01/2018   HIP SURGERY     fractured hip, has screws in place   INTRAOCULAR LENS REMOVAL Left 05/07/2022   Procedure: REMOVAL OF INTRAOCULAR LENS;  Surgeon: Alvia Norleen BIRCH, MD;  Location: [facility] OR;  Service: Ophthalmology;  Laterality: Left;   LASER PHOTO ABLATION Left 05/07/2022   Procedure: LASER PHOTO ABLATION;  Surgeon: Alvia Norleen BIRCH, MD;  Location: [facility] OR;  Service: Ophthalmology;  Laterality: Left;   LEFT HEART CATH AND CORONARY ANGIOGRAPHY Left 01/14/2018   Procedure: LEFT HEART CATH AND CORONARY ANGIOGRAPHY;  Surgeon: Hester Wolm PARAS, MD;  Location: [facility] INVASIVE CV LAB;  Service: Cardiovascular;  Laterality: Left;   PLACEMENT AND  SUTURE OF SECONDARY INTRAOCULAR LENS Left 05/07/2022   Procedure: PLACEMENT AND SUTURE OF SECONDARY INTRAOCULAR LENS;  Surgeon: Alvia Norleen BIRCH, MD;  Location: [facility] OR;  Service: Ophthalmology;  Laterality: Left;   TOTAL HIP ARTHROPLASTY Left 07/02/2016   Procedure: LEFT TOTAL HIP ARTHROPLASTY ANTERIOR APPROACH;  Surgeon: Donnice Car, MD;  Location: [facility] ORS;  Service: Orthopedics;  Laterality: Left;   Patient Active Problem List   Diagnosis Date Noted   Dislocated IOL (intraocular lens), posterior, left 05/07/2022   Arthritis 04/02/2018   GERD (gastroesophageal reflux disease) 04/02/2018   Hyperlipidemia 04/02/2018   Osteoporosis, post-menopausal 04/02/2018   Carotid stenosis, bilateral 02/06/2018   S/P CABG x 3 02/06/2018   Coronary artery disease involving native coronary artery of native heart 01/22/2018   Angina decubitus (HCC) 01/07/2018   S/P left THA, AA 07/02/2016   S/P hip replacement 07/02/2016    ONSET DATE: 11/19/23  REFERRING DIAG:  Diagnosis  R26.89 (ICD-10-CM) - Balance disorder    THERAPY DIAG:  Imbalance  Unsteadiness on feet  Balance disorder  Rationale for Evaluation and Treatment: Rehabilitation  SUBJECTIVE:  SUBJECTIVE STATEMENT: Pt reports that she fell in January as well as July. States that when she fell in July she broke her wrist and foot. Feels like her balance has been getting a little worse since she started taking Gabapentin  a few month ago.  Pt states that she did not wear post-op shoe following ankle metatarsal.   Pt accompanied by: self  PERTINENT HISTORY:   From recent hospitalization  7/2:  Patient is a 89 year old female who presented today following a fall.  Patient had laceration of her nose that was easily addressed with Dermabond, please see  procedure note for full details.  Discussed wound care instructions with her.  Abrasions were cleaned by the nurse followed by placing Band-Aids on them.  Patient's right wrist pain resulted in x-ray which showed comminuted and mildly displaced distal radial fracture.  Patient was placed in a sugar-tong splint and sling.  Patient left foot pain resulted in x-ray which showed oblique mildly displaced distal fifth metatarsal shaft fracture.  Patient was placed in a postop shoe.  Patient was given follow-up information for orthopedics.   PAIN:  Are you having pain? No  PRECAUTIONS: Other: wearing Wrist brace on the RUE.   RED FLAGS: None   WEIGHT BEARING RESTRICTIONS: Yes WBAT for RUE  FALLS: Has patient fallen in last 6 months? Yes. Number of falls 1  LIVING ENVIRONMENT: Lives with: lives with their spouse Lives in: House/apartment Stairs: Yes: Internal: flight steps; on right going up and External: 3 steps; on right going up Has following equipment at home: None  PLOF: Independent, Independent with basic ADLs, Independent with household mobility without device, Independent with gait, and Independent with transfers  PATIENT GOALS: feel steadier with walking. Pick feet up batter with walking   OBJECTIVE:  Note: Objective measures were completed at Evaluation unless otherwise noted.  DIAGNOSTIC FINDINGS:   DG Wrist IMPRESSION: Comminuted and mildly displaced distal radial fracture with radiocarpal intra-articular extension.    COGNITION: Overall cognitive status: Within functional limits for tasks assessed   SENSATION: WFL  COORDINATION: ankle to knee: WFL   EDEMA:  WFL  MUSCLE TONE: WFL   POSTURE: forward head  LOWER EXTREMITY MMT  MMT  Right Eval Left Eval  Hip flexion 4- 4  Hip extension 4 4+  Hip abduction 4 4  Hip adduction 4+ 4+  Hip internal rotation    Hip external rotation    Knee flexion 4 4+  Knee extension 4+ 4+  Ankle dorsiflexion 4+ 4+   Ankle plantarflexion    Ankle inversion    Ankle eversion     (Blank rows = not tested)  LOWER EXTREMITY ROM    Grossly WFL  BED MOBILITY:  Findings: Sit to supine Complete Independence Supine to sit Complete Independence Rolling to Right Complete Independence Rolling to Left Complete Independence  TRANSFERS: Sit to stand: Complete Independence  Assistive device utilized: None     Stand to sit: Complete Independence and Mod A  Assistive device utilized: None     Chair to chair: Complete Independence  Assistive device utilized: None        CURB:  Findings: CGA for safety without UE support   STAIRS: Findings: Level of Assistance: Modified independence, Stair Negotiation Technique: Step to Pattern Alternating Pattern  with Bilateral Rails, Number of Stairs: 6, Height of Stairs: 6   , and Comments: performed half of descent with step to, then returned to t GAIT: Findings: Gait Characteristics: WFL, step through pattern, and  narrow BOS, Distance walked: 70, Assistive device utilized:None, and Level of assistance: Complete Independence  FUNCTIONAL TESTS:  5 times sit to stand: 13.97 sec 6 minute walk test: to be completed  Berg Balance Scale:   [facility] PT Assessment - 01/08/24 0001       Berg Balance Test   Sit to Stand Able to stand without using hands and stabilize independently    Standing Unsupported Able to stand safely 2 minutes    Sitting with Back Unsupported but Feet Supported on Floor or Stool Able to sit safely and securely 2 minutes    Stand to Sit Sits safely with minimal use of hands    Transfers Able to transfer safely, minor use of hands    Standing Unsupported with Eyes Closed Able to stand 10 seconds safely    Standing Unsupported with Feet Together Able to place feet together independently and stand for 1 minute with supervision    From Standing, Reach Forward with Outstretched Arm Can reach forward >12 cm safely (5)    From Standing Position, Pick up  Object from Floor Able to pick up shoe, needs supervision    From Standing Position, Turn to Look Behind Over each Shoulder Turn sideways only but maintains balance    Turn 360 Degrees Able to turn 360 degrees safely but slowly    Standing Unsupported, Alternately Place Feet on Step/Stool Able to stand independently and complete 8 steps >20 seconds    Standing Unsupported, One Foot in Front Able to plae foot ahead of the other independently and hold 30 seconds    Standing on One Leg Able to lift leg independently and hold equal to or more than 3 seconds    Total Score 45      Functional Gait  Assessment   Gait Level Surface Walks 20 ft in less than 5.5 sec, no assistive devices, good speed, no evidence for imbalance, normal gait pattern, deviates no more than 6 in outside of the 12 in walkway width.    Change in Gait Speed Able to smoothly change walking speed without loss of balance or gait deviation. Deviate no more than 6 in outside of the 12 in walkway width.    Gait with Horizontal Head Turns Performs head turns smoothly with slight change in gait velocity (eg, minor disruption to smooth gait path), deviates 6-10 in outside 12 in walkway width, or uses an assistive device.    Gait with Vertical Head Turns Performs head turns with no change in gait. Deviates no more than 6 in outside 12 in walkway width.    Gait and Pivot Turn Pivot turns safely within 3 sec and stops quickly with no loss of balance.    Step Over Obstacle Is able to step over one shoe box (4.5 in total height) without changing gait speed. No evidence of imbalance.    Gait with Narrow Base of Support Ambulates less than 4 steps heel to toe or cannot perform without assistance.    Gait with Eyes Closed Walks 20 ft, slow speed, abnormal gait pattern, evidence for imbalance, deviates 10-15 in outside 12 in walkway width. Requires more than 9 sec to ambulate 20 ft.    Ambulating Backwards Walks 20 ft, uses assistive device, slower  speed, mild gait deviations, deviates 6-10 in outside 12 in walkway width.    Steps Alternating feet, must use rail.    Total Score 21          Interpretation of scores: Non-Specific  Older Adults Cutoff Score: <=22/30 = risk of falls Parkinson's Disease Cutoff score <15/30= fall risk (Hoehn & Yahr 1-4)  Minimally Clinically Important Difference (MCID)  Stroke (acute, subacute, and chronic) = MDC: 4.2 points Vestibular (acute) = MDC: 6 points Community Dwelling Older Adults =  MCID: 4 points Parkinson's Disease  =  MDC: 4.3 points  (Academy of Neurologic Physical Therapy (nd). Functional Gait Assessment. Retrieved from https://www.neuropt.org/docs/default-source/cpgs/core-outcome-measures/function-gait-assessment-pocket-guide-proof9-(2).pdf?sfvrsn=b24f35043_0.)  PATIENT SURVEYS:  ABC scale: The Activities-Specific Balance Confidence (ABC) Scale 0% 10 20 30  40 50 60 70 80 90 100% No confidence<->completely confident  "How confident are you that you will not lose your balance or become unsteady when you . . .   Date tested   Walk around the house   2. Walk up or down stairs   3. Bend over and pick up a slipper from in front of a closet floor   4. Reach for a small can off a shelf at eye level   5. Stand on tip toes and reach for something above your head   6. Stand on a chair and reach for something   7. Sweep the floor   8. Walk outside the house to a car parked in the driveway   9. Get into or out of a car   10. Walk across a parking lot to the mall   11. Walk up or down a ramp   12. Walk in a crowded mall where people rapidly walk past you   13. Are bumped into by people as you walk through the mall   14. Step onto or off of an escalator while you are holding onto the railing   15. Step onto or off an escalator while holding onto parcels such that you cannot hold onto the railing   16. Walk outside on icy sidewalks   Total: #/16 84%                                                                                                                                  TREATMENT DATE: 8/21  PT evaluation as listed above and education as listed below.     PATIENT EDUCATION: Education details: POC, Goals, Outcome interpretation, need and benefits of PT for improved safety and balance with community mobility  Person educated: Patient Education method: Medical illustrator Education comprehension: verbalized understanding  HOME EXERCISE PROGRAM: TO BE GIVEN AT NEX APPOINTMENT  GOALS: Goals reviewed with patient? Yes  SHORT TERM GOALS: Target date: 02/06/2024    Patient will be independent in home exercise program to improve strength/mobility for better functional independence with ADLs. Baseline: to be given at visit 2  Goal status: INITIAL   LONG TERM GOALS: Target date: 03/05/2024    Patient will increase ABC scale by 5 points % to demonstrate better functional mobility and better confidence with ADLs.  Baseline: 84% Goal status: INITIAL  2.  Patient (> 60 years  old) will complete five times sit to stand test in < 15 seconds indicating an increased LE strength and improved balance. Baseline:13.7 sec Goal status: INITIAL  3.  Patient will increase Berg Balance score by > 6 points to demonstrate decreased fall risk during functional activities Baseline: 45/56 Goal status: INITIAL  4.  Patient will increase 6 min walk test to >1.71m/s as to improve gait speed for better community ambulation and to reduce fall risk. Baseline: to be assessed at visit 2 Goal status: INITIAL  5.  Patient will increase SLS time to >10 sec bil to improve safety with ADLs and community mobility to reduce fall risk and improve safety with stair management  Baseline: R 2 sec L 4 sec  Goal status: INITIAL  6.  Patient will increase FGA score by 4 points  as to demonstrate reduced fall risk and improved dynamic gait balance for better safety with community/home  ambulation.  Baseline: 21 Goal status: INITIAL   ASSESSMENT:  CLINICAL IMPRESSION: Patient is a 89 y.o. Female who was seen today for physical therapy evaluation and treatment for balance deficits with recent traumatic falls. Pt demonstrates grossly WLF strength for age in BLE, but increased fall risk noted with Berg of 45 and FGA of 22 and slight increased 5x STS.  Pt will benefit from skilled PT to reduce fall risk and improve independence with caregiver tasks for Husband with advanced Alzheimer's  dementia.   OBJECTIVE IMPAIRMENTS: Abnormal gait, decreased balance, decreased knowledge of condition, decreased mobility, difficulty walking, and decreased strength.   ACTIVITY LIMITATIONS: squatting, stairs, locomotion level, and caring for others  PARTICIPATION LIMITATIONS: shopping, community activity, yard work, and church  PERSONAL FACTORS: Age and 1-2 comorbidities: OA and macualar degeneration are also affecting patient's functional outcome.   REHAB POTENTIAL: Good  CLINICAL DECISION MAKING: Evolving/moderate complexity  EVALUATION COMPLEXITY: Moderate  PLAN:  PT FREQUENCY: 1-2x/week  PT DURATION: 12 weeks  PLANNED INTERVENTIONS: [id number]- PT Re-evaluation, 97750- Physical Performance Testing, 97110-Therapeutic exercises, 97530- Therapeutic activity, 97112- Neuromuscular re-education, 97535- Self Care, [id number]- Manual therapy, [phone]- Gait training, [phone]- Canalith repositioning, Patient/Family education, Balance training, Stair training, Joint mobilization, Joint manipulation, Vestibular training, Visual/preceptual remediation/compensation, DME instructions, Cryotherapy, and Moist heat  PLAN FOR NEXT SESSION: complete 6 min walk test. Initiate balance and generalized strengthening HEP([place]?)   Massie FORBES Dollar, PT 01/09/2024, 6:00 AM

## 2024-01-13 ENCOUNTER — Telehealth: Payer: Self-pay | Admitting: Physical Therapy

## 2024-01-13 ENCOUNTER — Ambulatory Visit: Admitting: Physical Therapy

## 2024-01-13 NOTE — Therapy (Incomplete)
 OUTPATIENT PHYSICAL THERAPY NEURO TREATMENT   Patient Name: Darlene Barry MRN: [id number] DOB:April 27, 1932, 89 y.o., female Today's Date: 01/13/2024   PCP: Darlene Agent, MD  REFERRING PROVIDER: Maree Jannett POUR, MD   END OF SESSION: ***     Past Medical History:  Diagnosis Date   Arthritis    Coronary artery disease    CABG 01/2018   Detached retina    Fibrocystic breast disease    GERD (gastroesophageal reflux disease)    Hyperlipidemia    Macular degeneration    Osteoporosis    Past Surgical History:  Procedure Laterality Date   25 GAUGE PARS PLANA VITRECTOMY WITH 20 GAUGE MVR PORT Left 05/07/2022   Procedure: 25 GAUGE PARS PLANA VITRECTOMY WITH 20 GAUGE MVR PORT;  Surgeon: Darlene Norleen BIRCH, MD;  Location: [facility] OR;  Service: Ophthalmology;  Laterality: Left;   BREAST EXCISIONAL BIOPSY Right 1970   NEG   CATARACT EXTRACTION, BILATERAL     CORONARY ARTERY BYPASS GRAFT     01/2018   HIP SURGERY     fractured hip, has screws in place   INTRAOCULAR LENS REMOVAL Left 05/07/2022   Procedure: REMOVAL OF INTRAOCULAR LENS;  Surgeon: Darlene Norleen BIRCH, MD;  Location: [facility] OR;  Service: Ophthalmology;  Laterality: Left;   LASER PHOTO ABLATION Left 05/07/2022   Procedure: LASER PHOTO ABLATION;  Surgeon: Darlene Norleen BIRCH, MD;  Location: [facility] OR;  Service: Ophthalmology;  Laterality: Left;   LEFT HEART CATH AND CORONARY ANGIOGRAPHY Left 01/14/2018   Procedure: LEFT HEART CATH AND CORONARY ANGIOGRAPHY;  Surgeon: Darlene Wolm PARAS, MD;  Location: [facility] INVASIVE CV LAB;  Service: Cardiovascular;  Laterality: Left;   PLACEMENT AND SUTURE OF SECONDARY INTRAOCULAR LENS Left 05/07/2022   Procedure: PLACEMENT AND SUTURE OF SECONDARY INTRAOCULAR LENS;  Surgeon: Darlene Norleen BIRCH, MD;  Location: [facility] OR;  Service: Ophthalmology;  Laterality: Left;   TOTAL HIP ARTHROPLASTY Left 07/02/2016   Procedure: LEFT TOTAL HIP ARTHROPLASTY ANTERIOR APPROACH;  Surgeon: Darlene Car, MD;  Location: [facility] ORS;  Service:  Orthopedics;  Laterality: Left;   Patient Active Problem List   Diagnosis Date Noted   Dislocated IOL (intraocular lens), posterior, left 05/07/2022   Arthritis 04/02/2018   GERD (gastroesophageal reflux disease) 04/02/2018   Hyperlipidemia 04/02/2018   Osteoporosis, post-menopausal 04/02/2018   Carotid stenosis, bilateral 02/06/2018   S/P CABG x 3 02/06/2018   Coronary artery disease involving native coronary artery of native heart 01/22/2018   Angina decubitus (HCC) 01/07/2018   S/P left THA, AA 07/02/2016   S/P hip replacement 07/02/2016    ONSET DATE: 11/19/23  REFERRING DIAG:  Diagnosis  R26.89 (ICD-10-CM) - Balance disorder    THERAPY DIAG: ***  No diagnosis found.  Rationale for Evaluation and Treatment: Rehabilitation  SUBJECTIVE:  SUBJECTIVE STATEMENT: ***   Pt reports that she fell in January as well as July. States that when she fell in July she broke her wrist and foot. Feels like her balance has been getting a little worse since she started taking Gabapentin  a few month ago.  Pt states that she did not wear post-op shoe following ankle metatarsal.   Pt accompanied by: self  PERTINENT HISTORY:   From recent hospitalization  7/2:  Patient is a 89 year old female who presented today following a fall.  Patient had laceration of her nose that was easily addressed with Dermabond, please see procedure note for full details.  Discussed wound care instructions with her.  Abrasions were cleaned by the nurse followed by placing Band-Aids on them.  Patient's right wrist pain resulted in x-ray which showed comminuted and mildly displaced distal radial fracture.  Patient was placed in a sugar-tong splint and sling.  Patient left foot pain resulted in x-ray which showed oblique mildly displaced  distal fifth metatarsal shaft fracture.  Patient was placed in a postop shoe.  Patient was given follow-up information for orthopedics.   PAIN:  Are you having pain? No  PRECAUTIONS: Other: wearing Wrist brace on the RUE.   RED FLAGS: None   WEIGHT BEARING RESTRICTIONS: Yes WBAT for RUE  FALLS: Has patient fallen in last 6 months? Yes. Number of falls 1  LIVING ENVIRONMENT: Lives with: lives with their spouse Lives in: House/apartment Stairs: Yes: Internal: flight steps; on right going up and External: 3 steps; on right going up Has following equipment at home: None  PLOF: Independent, Independent with basic ADLs, Independent with household mobility without device, Independent with gait, and Independent with transfers  PATIENT GOALS: feel steadier with walking. Pick feet up batter with walking   OBJECTIVE:  Note: Objective measures were completed at Evaluation unless otherwise noted.  DIAGNOSTIC FINDINGS:   DG Wrist IMPRESSION: Comminuted and mildly displaced distal radial fracture with radiocarpal intra-articular extension.    COGNITION: Overall cognitive status: Within functional limits for tasks assessed   SENSATION: WFL  COORDINATION: ankle to knee: WFL   EDEMA:  WFL  MUSCLE TONE: WFL   POSTURE: forward head  LOWER EXTREMITY MMT  MMT  Right Eval Left Eval  Hip flexion 4- 4  Hip extension 4 4+  Hip abduction 4 4  Hip adduction 4+ 4+  Hip internal rotation    Hip external rotation    Knee flexion 4 4+  Knee extension 4+ 4+  Ankle dorsiflexion 4+ 4+  Ankle plantarflexion    Ankle inversion    Ankle eversion     (Blank rows = not tested)  LOWER EXTREMITY ROM    Grossly WFL  BED MOBILITY:  Findings: Sit to supine Complete Independence Supine to sit Complete Independence Rolling to Right Complete Independence Rolling to Left Complete Independence  TRANSFERS: Sit to stand: Complete Independence  Assistive device utilized: None     Stand  to sit: Complete Independence and Mod A  Assistive device utilized: None     Chair to chair: Complete Independence  Assistive device utilized: None        CURB:  Findings: CGA for safety without UE support   STAIRS: Findings: Level of Assistance: Modified independence, Stair Negotiation Technique: Step to Pattern Alternating Pattern  with Bilateral Rails, Number of Stairs: 6, Height of Stairs: 6   , and Comments: performed half of descent with step to, then returned to t GAIT: Findings: Gait Characteristics: WFL, step  through pattern, and narrow BOS, Distance walked: 70, Assistive device utilized:None, and Level of assistance: Complete Independence  FUNCTIONAL TESTS:  5 times sit to stand: 13.97 sec 6 minute walk test: to be completed  Berg Balance Scale:     Interpretation of scores: Non-Specific Older Adults Cutoff Score: <=22/30 = risk of falls Parkinson's Disease Cutoff score <15/30= fall risk (Hoehn & Yahr 1-4)  Minimally Clinically Important Difference (MCID)  Stroke (acute, subacute, and chronic) = MDC: 4.2 points Vestibular (acute) = MDC: 6 points Community Dwelling Older Adults =  MCID: 4 points Parkinson's Disease  =  MDC: 4.3 points  (Academy of Neurologic Physical Therapy (nd). Functional Gait Assessment. Retrieved from https://www.neuropt.org/docs/default-source/cpgs/core-outcome-measures/function-gait-assessment-pocket-guide-proof9-(2).pdf?sfvrsn=b12f35043_0.)  PATIENT SURVEYS:  ABC scale: The Activities-Specific Balance Confidence (ABC) Scale 0% 10 20 30  40 50 60 70 80 90 100% No confidence<->completely confident  "How confident are you that you will not lose your balance or become unsteady when you . . .   Date tested   Walk around the house   2. Walk up or down stairs   3. Bend over and pick up a slipper from in front of a closet floor   4. Reach for a small can off a shelf at eye level   5. Stand on tip toes and reach for something above your head   6.  Stand on a chair and reach for something   7. Sweep the floor   8. Walk outside the house to a Barry parked in the driveway   9. Get into or out of a Barry   10. Walk across a parking lot to the mall   11. Walk up or down a ramp   12. Walk in a crowded mall where people rapidly walk past you   13. Are bumped into by people as you walk through the mall   14. Step onto or off of an escalator while you are holding onto the railing   15. Step onto or off an escalator while holding onto parcels such that you cannot hold onto the railing   16. Walk outside on icy sidewalks   Total: #/16 84%                                                                                                                                 TREATMENT DATE: 01/13/2024  ***  PT evaluation as listed above and education as listed below.     PATIENT EDUCATION: Education details: POC, Goals, Outcome interpretation, need and benefits of PT for improved safety and balance with community mobility  Person educated: Patient Education method: Medical illustrator Education comprehension: verbalized understanding  HOME EXERCISE PROGRAM: TO BE GIVEN AT NEX APPOINTMENT  GOALS: Goals reviewed with patient? Yes  SHORT TERM GOALS: Target date: 02/06/2024    Patient will be independent in home exercise program to improve strength/mobility for better functional independence with ADLs. Baseline: to be given at  visit 2  Goal status: INITIAL   LONG TERM GOALS: Target date: 03/05/2024    Patient will increase ABC scale by 5 points % to demonstrate better functional mobility and better confidence with ADLs.  Baseline: 84% Goal status: INITIAL  2.  Patient (> 21 years old) will complete five times sit to stand test in < 15 seconds indicating an increased LE strength and improved balance. Baseline:13.7 sec Goal status: INITIAL  3.  Patient will increase Berg Balance score by > 6 points to demonstrate decreased fall  risk during functional activities Baseline: 45/56 Goal status: INITIAL  4.  Patient will increase 6 min walk test to >1.66m/s as to improve gait speed for better community ambulation and to reduce fall risk. Baseline: to be assessed at visit 2 Goal status: INITIAL  5.  Patient will increase SLS time to >10 sec bil to improve safety with ADLs and community mobility to reduce fall risk and improve safety with stair management  Baseline: R 2 sec L 4 sec  Goal status: INITIAL  6.  Patient will increase FGA score by 4 points  as to demonstrate reduced fall risk and improved dynamic gait balance for better safety with community/home ambulation.  Baseline: 21 Goal status: INITIAL   ASSESSMENT:  CLINICAL IMPRESSION:  ***   Patient is a 89 y.o. Female who was seen today for physical therapy evaluation and treatment for balance deficits with recent traumatic falls. Pt demonstrates grossly WLF strength for age in BLE, but increased fall risk noted with Berg of 45 and FGA of 22 and slight increased 5x STS.  Pt will benefit from skilled PT to reduce fall risk and improve independence with caregiver tasks for Husband with advanced Alzheimer's  dementia.   OBJECTIVE IMPAIRMENTS: Abnormal gait, decreased balance, decreased knowledge of condition, decreased mobility, difficulty walking, and decreased strength.   ACTIVITY LIMITATIONS: squatting, stairs, locomotion level, and caring for others  PARTICIPATION LIMITATIONS: shopping, community activity, yard work, and church  PERSONAL FACTORS: Age and 1-2 comorbidities: OA and macualar degeneration are also affecting patient's functional outcome.   REHAB POTENTIAL: Good  CLINICAL DECISION MAKING: Evolving/moderate complexity  EVALUATION COMPLEXITY: Moderate  PLAN:  PT FREQUENCY: 1-2x/week  PT DURATION: 12 weeks  PLANNED INTERVENTIONS: [id number]- PT Re-evaluation, 97750- Physical Performance Testing, 97110-Therapeutic exercises, 97530- Therapeutic  activity, 97112- Neuromuscular re-education, 97535- Self Care, [id number]- Manual therapy, [phone]- Gait training, [phone]- Canalith repositioning, Patient/Family education, Balance training, Stair training, Joint mobilization, Joint manipulation, Vestibular training, Visual/preceptual remediation/compensation, DME instructions, Cryotherapy, and Moist heat  PLAN FOR NEXT SESSION: ***   complete 6 min walk test. Initiate balance and generalized strengthening HEP([place]?)   Chiquita Silvan, SPT Physical Therapy Student - [facility]  [facility]  [place], Student-PT 01/13/2024, 7:50 AM

## 2024-01-15 ENCOUNTER — Ambulatory Visit: Admitting: Physical Therapy

## 2024-01-20 ENCOUNTER — Ambulatory Visit: Admitting: Physical Therapy

## 2024-01-27 ENCOUNTER — Ambulatory Visit: Attending: Neurology | Admitting: Physical Therapy

## 2024-01-27 ENCOUNTER — Telehealth: Payer: Self-pay | Admitting: Physical Therapy

## 2024-01-27 DIAGNOSIS — R2681 Unsteadiness on feet: Secondary | ICD-10-CM | POA: Insufficient documentation

## 2024-01-27 DIAGNOSIS — R2689 Other abnormalities of gait and mobility: Secondary | ICD-10-CM | POA: Insufficient documentation

## 2024-01-27 NOTE — Telephone Encounter (Signed)
 Pt contacted via telephone and author left voice mail informing of missed appointment and informed pt of future PT appointment date and time. Also notified pt of attendance policy.    Massie Dollar PT, DPT  Physical Therapist - [place]  [facility]  1:54 PM 01/27/24

## 2024-01-29 ENCOUNTER — Ambulatory Visit: Admitting: Physical Therapy

## 2024-02-03 ENCOUNTER — Ambulatory Visit: Admitting: Physical Therapy

## 2024-02-05 ENCOUNTER — Ambulatory Visit: Admitting: Physical Therapy

## 2024-02-06 ENCOUNTER — Ambulatory Visit

## 2024-02-06 DIAGNOSIS — R2681 Unsteadiness on feet: Secondary | ICD-10-CM

## 2024-02-06 DIAGNOSIS — R2689 Other abnormalities of gait and mobility: Secondary | ICD-10-CM

## 2024-02-06 NOTE — Therapy (Signed)
 OUTPATIENT PHYSICAL THERAPY NEURO TREATMENT   Patient Name: Darlene Barry MRN: [id number] DOB:[birth year], 89 y.o., female Today's Date: 02/06/2024   PCP: Valora Agent, MD  REFERRING PROVIDER: Maree Jannett POUR, MD   END OF SESSION:    PT End of Session - 02/06/24 0806     Visit Number 2    Number of Visits 8    Date for Recertification  03/05/24    Progress Note Due on Visit 10    PT Start Time 0801    PT Stop Time 0845    PT Time Calculation (min) 44 min    Equipment Utilized During Treatment Gait belt    Activity Tolerance Patient tolerated treatment well    Behavior During Therapy [facility] for tasks assessed/performed           Past Medical History:  Diagnosis Date   Arthritis    Coronary artery disease    CABG 01/2018   Detached retina    Fibrocystic breast disease    GERD (gastroesophageal reflux disease)    Hyperlipidemia    Macular degeneration    Osteoporosis    Past Surgical History:  Procedure Laterality Date   25 GAUGE PARS PLANA VITRECTOMY WITH 20 GAUGE MVR PORT Left 05/07/2022   Procedure: 25 GAUGE PARS PLANA VITRECTOMY WITH 20 GAUGE MVR PORT;  Surgeon: Alvia Norleen BIRCH, MD;  Location: [facility] OR;  Service: Ophthalmology;  Laterality: Left;   BREAST EXCISIONAL BIOPSY Right 1970   NEG   CATARACT EXTRACTION, BILATERAL     CORONARY ARTERY BYPASS GRAFT     01/2018   HIP SURGERY     fractured hip, has screws in place   INTRAOCULAR LENS REMOVAL Left 05/07/2022   Procedure: REMOVAL OF INTRAOCULAR LENS;  Surgeon: Alvia Norleen BIRCH, MD;  Location: [facility] OR;  Service: Ophthalmology;  Laterality: Left;   LASER PHOTO ABLATION Left 05/07/2022   Procedure: LASER PHOTO ABLATION;  Surgeon: Alvia Norleen BIRCH, MD;  Location: [facility] OR;  Service: Ophthalmology;  Laterality: Left;   LEFT HEART CATH AND CORONARY ANGIOGRAPHY Left 01/14/2018   Procedure: LEFT HEART CATH AND CORONARY ANGIOGRAPHY;  Surgeon: Hester Wolm PARAS, MD;  Location: [facility] INVASIVE CV LAB;  Service: Cardiovascular;   Laterality: Left;   PLACEMENT AND SUTURE OF SECONDARY INTRAOCULAR LENS Left 05/07/2022   Procedure: PLACEMENT AND SUTURE OF SECONDARY INTRAOCULAR LENS;  Surgeon: Alvia Norleen BIRCH, MD;  Location: [facility] OR;  Service: Ophthalmology;  Laterality: Left;   TOTAL HIP ARTHROPLASTY Left 07/02/2016   Procedure: LEFT TOTAL HIP ARTHROPLASTY ANTERIOR APPROACH;  Surgeon: Donnice Car, MD;  Location: [facility] ORS;  Service: Orthopedics;  Laterality: Left;   Patient Active Problem List   Diagnosis Date Noted   Dislocated IOL (intraocular lens), posterior, left 05/07/2022   Arthritis 04/02/2018   GERD (gastroesophageal reflux disease) 04/02/2018   Hyperlipidemia 04/02/2018   Osteoporosis, post-menopausal 04/02/2018   Carotid stenosis, bilateral 02/06/2018   S/P CABG x 3 02/06/2018   Coronary artery disease involving native coronary artery of native heart 01/22/2018   Angina decubitus (HCC) 01/07/2018   S/P left THA, AA 07/02/2016   S/P hip replacement 07/02/2016    ONSET DATE: 11/19/23  REFERRING DIAG:  Diagnosis  R26.89 (ICD-10-CM) - Balance disorder    THERAPY DIAG:   Imbalance  Unsteadiness on feet  Balance disorder  Rationale for Evaluation and Treatment: Rehabilitation  SUBJECTIVE:  SUBJECTIVE STATEMENT: Patient reports that she has missed last couple of weeks due to her husband passing. States she has been through a lot and just needed some time to recover. She denies any falls since last session   Pt reports that she fell in January as well as July. States that when she fell in July she broke her wrist and foot. Feels like her balance has been getting a little worse since she started taking Gabapentin  a few month ago.  Pt states that she did not wear post-op shoe following ankle metatarsal.   Pt accompanied  by: self  PERTINENT HISTORY:   From recent hospitalization  7/2:  Patient is a 89 year old female who presented today following a fall.  Patient had laceration of her nose that was easily addressed with Dermabond, please see procedure note for full details.  Discussed wound care instructions with her.  Abrasions were cleaned by the nurse followed by placing Band-Aids on them.  Patient's right wrist pain resulted in x-ray which showed comminuted and mildly displaced distal radial fracture.  Patient was placed in a sugar-tong splint and sling.  Patient left foot pain resulted in x-ray which showed oblique mildly displaced distal fifth metatarsal shaft fracture.  Patient was placed in a postop shoe.  Patient was given follow-up information for orthopedics.   PAIN:  Are you having pain? No  PRECAUTIONS: Other: wearing Wrist brace on the RUE.   RED FLAGS: None   WEIGHT BEARING RESTRICTIONS: Yes WBAT for RUE  FALLS: Has patient fallen in last 6 months? Yes. Number of falls 1  LIVING ENVIRONMENT: Lives with: lives with their spouse Lives in: House/apartment Stairs: Yes: Internal: flight steps; on right going up and External: 3 steps; on right going up Has following equipment at home: None  PLOF: Independent, Independent with basic ADLs, Independent with household mobility without device, Independent with gait, and Independent with transfers  PATIENT GOALS: feel steadier with walking. Pick feet up batter with walking   OBJECTIVE:  Note: Objective measures were completed at Evaluation unless otherwise noted.  DIAGNOSTIC FINDINGS:   DG Wrist IMPRESSION: Comminuted and mildly displaced distal radial fracture with radiocarpal intra-articular extension.    COGNITION: Overall cognitive status: Within functional limits for tasks assessed   SENSATION: WFL  COORDINATION: ankle to knee: WFL   EDEMA:  WFL  MUSCLE TONE: WFL   POSTURE: forward head  LOWER EXTREMITY MMT  MMT   Right Eval Left Eval  Hip flexion 4- 4  Hip extension 4 4+  Hip abduction 4 4  Hip adduction 4+ 4+  Hip internal rotation    Hip external rotation    Knee flexion 4 4+  Knee extension 4+ 4+  Ankle dorsiflexion 4+ 4+  Ankle plantarflexion    Ankle inversion    Ankle eversion     (Blank rows = not tested)  LOWER EXTREMITY ROM    Grossly WFL  BED MOBILITY:  Findings: Sit to supine Complete Independence Supine to sit Complete Independence Rolling to Right Complete Independence Rolling to Left Complete Independence  TRANSFERS: Sit to stand: Complete Independence  Assistive device utilized: None     Stand to sit: Complete Independence and Mod A  Assistive device utilized: None     Chair to chair: Complete Independence  Assistive device utilized: None        CURB:  Findings: CGA for safety without UE support   STAIRS: Findings: Level of Assistance: Modified independence, Stair Negotiation Technique: Step to Pattern Alternating  Pattern  with Bilateral Rails, Number of Stairs: 6, Height of Stairs: 6   , and Comments: performed half of descent with step to, then returned to t GAIT: Findings: Gait Characteristics: WFL, step through pattern, and narrow BOS, Distance walked: 70, Assistive device utilized:None, and Level of assistance: Complete Independence  FUNCTIONAL TESTS:  5 times sit to stand: 13.97 sec 6 minute walk test: to be completed  Berg Balance Scale:     Interpretation of scores: Non-Specific Older Adults Cutoff Score: <=22/30 = risk of falls Parkinson's Disease Cutoff score <15/30= fall risk (Hoehn & Yahr 1-4)  Minimally Clinically Important Difference (MCID)  Stroke (acute, subacute, and chronic) = MDC: 4.2 points Vestibular (acute) = MDC: 6 points Community Dwelling Older Adults =  MCID: 4 points Parkinson's Disease  =  MDC: 4.3 points  (Academy of Neurologic Physical Therapy (nd). Functional Gait Assessment. Retrieved from  https://www.neuropt.org/docs/default-source/cpgs/core-outcome-measures/function-gait-assessment-pocket-guide-proof9-(2).pdf?sfvrsn=b35f35043_0.)  PATIENT SURVEYS:  ABC scale: The Activities-Specific Balance Confidence (ABC) Scale 0% 10 20 30  40 50 60 70 80 90 100% No confidence<->completely confident  "How confident are you that you will not lose your balance or become unsteady when you . . .   Date tested   Walk around the house   2. Walk up or down stairs   3. Bend over and pick up a slipper from in front of a closet floor   4. Reach for a small can off a shelf at eye level   5. Stand on tip toes and reach for something above your head   6. Stand on a chair and reach for something   7. Sweep the floor   8. Walk outside the house to a car parked in the driveway   9. Get into or out of a car   10. Walk across a parking lot to the mall   11. Walk up or down a ramp   12. Walk in a crowded mall where people rapidly walk past you   13. Are bumped into by people as you walk through the mall   14. Step onto or off of an escalator while you are holding onto the railing   15. Step onto or off an escalator while holding onto parcels such that you cannot hold onto the railing   16. Walk outside on icy sidewalks   Total: #/16 84%                                                                                                                                 TREATMENT DATE: 02/06/2024   6 Min Walk Test:  Instructed patient to ambulate as quickly and as safely as possible for 6 minutes using LRAD. Patient was allowed to take standing rest breaks without stopping the test, but if the patient required a sitting rest break the clock would be stopped and the test would be over.  Results: 870 feet (265 meters, Avg speed 0.58m/s) using  no AD with CGA. Results indicate that the patient has reduced endurance with ambulation compared to age matched norms.  Age Matched Norms: 43-69 yo M: 49 F: 38, 72-79 yo  M: 92 F: 471, 27-89 yo M: 417 F: 392 MDC: 58.21 meters (190.98 feet) or 50 meters (ANPTA Core Set of Outcome Measures for Adults with Neurologic Conditions, 2018)   Self care/Home management:   Added the following to her HEP today:  Exercises - Sit to Stand Without Arm Support  2 x 10 reps (Patient preferred arms out vs. Arms crossed) -Standing Tandem Balance with hands hovering over support bar- multiple attempts- at best 14 sec  - Dynamic high knee march without UE support x 20 reps x 2 sets - Standing Balance in Corner with Eyes Closed - hold up to 20 sec with feet apart then feet narrowed x 4 sets   PATIENT EDUCATION: Education details: Sensory components of balance, Education in [employer] Person educated: Patient Education method: Medical illustrator Education comprehension: verbalized understanding  HOME EXERCISE PROGRAM: Access Code: [email or web address] URL: [email or web address] Date: 02/06/2024 Prepared by: Reyes London  Exercises - Sit to Stand Without Arm Support  - 3 x weekly - 3 sets - 10 reps - Standing Tandem Balance with Counter Support  - 3 x weekly - 3 sets - up to 30 sec hold - Standing March  - 3 x weekly - 3 sets - 10 reps - 2 sec hold - Standing Balance in Corner with Eyes Closed  - 2 x weekly - 3 sets - 20 hold  GOALS: Goals reviewed with patient? Yes  SHORT TERM GOALS: Target date: 02/06/2024    Patient will be independent in home exercise program to improve strength/mobility for better functional independence with ADLs. Baseline: to be given at visit 2  Goal status: INITIAL   LONG TERM GOALS: Target date: 03/05/2024    Patient will increase ABC scale by 5 points % to demonstrate better functional mobility and better confidence with ADLs.  Baseline: 84% Goal status: INITIAL  2.  Patient (> 64 years old) will complete five times sit to stand test in < 15 seconds indicating an increased LE strength and improved  balance. Baseline:13.7 sec Goal status: INITIAL  3.  Patient will increase Berg Balance score by > 6 points to demonstrate decreased fall risk during functional activities Baseline: 45/56 Goal status: INITIAL  4.  Patient will increase 6 min walk test to >1000 feet to improve gait speed and distance for better community ambulation and to reduce fall risk. Baseline: to be assessed at visit 2; 02/06/2024= 875 Goal status: REVISED  5.  Patient will increase SLS time to >10 sec bil to improve safety with ADLs and community mobility to reduce fall risk and improve safety with stair management  Baseline: R 2 sec L 4 sec  Goal status: INITIAL  6.  Patient will increase FGA score by 4 points  as to demonstrate reduced fall risk and improved dynamic gait balance for better safety with community/home ambulation.  Baseline: 21 Goal status: INITIAL   ASSESSMENT:  CLINICAL IMPRESSION:     Patient is a 89 y.o. Female who was seen today for physical therapy  treatment for balance deficits with recent traumatic falls. Pt did present with decreased overall gait endurance- decreased step/stride length and some fatigue. She was later instructed in a HEP comprised of some beginner LE strengthening and balance activities and performed well- challenged with SLS standing/marching and later in corner with  eyes closed. Will benefit from review and progress next session. Pt. will benefit from skilled PT to reduce fall risk and improve independence with all community ambulation.  OBJECTIVE IMPAIRMENTS: Abnormal gait, decreased balance, decreased knowledge of condition, decreased mobility, difficulty walking, and decreased strength.   ACTIVITY LIMITATIONS: squatting, stairs, locomotion level, and caring for others  PARTICIPATION LIMITATIONS: shopping, community activity, yard work, and church  PERSONAL FACTORS: Age and 1-2 comorbidities: OA and macualar degeneration are also affecting patient's functional  outcome.   REHAB POTENTIAL: Good  CLINICAL DECISION MAKING: Evolving/moderate complexity  EVALUATION COMPLEXITY: Moderate  PLAN:  PT FREQUENCY: 1-2x/week  PT DURATION: 12 weeks  PLANNED INTERVENTIONS: [id number]- PT Re-evaluation, 97750- Physical Performance Testing, 97110-Therapeutic exercises, 97530- Therapeutic activity, 97112- Neuromuscular re-education, 97535- Self Care, [id number]- Manual therapy, [phone]- Gait training, [phone]- Canalith repositioning, Patient/Family education, Balance training, Stair training, Joint mobilization, Joint manipulation, Vestibular training, Visual/preceptual remediation/compensation, DME instructions, Cryotherapy, and Moist heat  PLAN FOR NEXT SESSION:    Progress  balance and generalized LE strengthening     Reyes LOISE London, PT Physical Therapist  [facility] [phone] 02/06/2024, 10:55 AM

## 2024-02-10 ENCOUNTER — Ambulatory Visit: Admitting: Physical Therapy

## 2024-02-12 NOTE — Therapy (Signed)
 OUTPATIENT PHYSICAL THERAPY NEURO TREATMENT   Patient Name: Darlene Barry MRN: [id number] DOB:06-12-1931, 89 y.o., female Today's Date: 02/13/2024   PCP: Valora Agent, MD  REFERRING PROVIDER: Maree Jannett POUR, MD   END OF SESSION:    PT End of Session - 02/13/24 0758     Visit Number 3    Number of Visits 8    Date for Recertification  03/05/24    Progress Note Due on Visit 10    PT Start Time 0759    PT Stop Time 0841    PT Time Calculation (min) 42 min    Equipment Utilized During Treatment Gait belt    Activity Tolerance Patient tolerated treatment well    Behavior During Therapy [facility] for tasks assessed/performed            Past Medical History:  Diagnosis Date   Arthritis    Coronary artery disease    CABG 01/2018   Detached retina    Fibrocystic breast disease    GERD (gastroesophageal reflux disease)    Hyperlipidemia    Macular degeneration    Osteoporosis    Past Surgical History:  Procedure Laterality Date   25 GAUGE PARS PLANA VITRECTOMY WITH 20 GAUGE MVR PORT Left 05/07/2022   Procedure: 25 GAUGE PARS PLANA VITRECTOMY WITH 20 GAUGE MVR PORT;  Surgeon: Alvia Norleen BIRCH, MD;  Location: [facility] OR;  Service: Ophthalmology;  Laterality: Left;   BREAST EXCISIONAL BIOPSY Right 1970   NEG   CATARACT EXTRACTION, BILATERAL     CORONARY ARTERY BYPASS GRAFT     01/2018   HIP SURGERY     fractured hip, has screws in place   INTRAOCULAR LENS REMOVAL Left 05/07/2022   Procedure: REMOVAL OF INTRAOCULAR LENS;  Surgeon: Alvia Norleen BIRCH, MD;  Location: [facility] OR;  Service: Ophthalmology;  Laterality: Left;   LASER PHOTO ABLATION Left 05/07/2022   Procedure: LASER PHOTO ABLATION;  Surgeon: Alvia Norleen BIRCH, MD;  Location: [facility] OR;  Service: Ophthalmology;  Laterality: Left;   LEFT HEART CATH AND CORONARY ANGIOGRAPHY Left 01/14/2018   Procedure: LEFT HEART CATH AND CORONARY ANGIOGRAPHY;  Surgeon: Hester Wolm PARAS, MD;  Location: [facility] INVASIVE CV LAB;  Service: Cardiovascular;   Laterality: Left;   PLACEMENT AND SUTURE OF SECONDARY INTRAOCULAR LENS Left 05/07/2022   Procedure: PLACEMENT AND SUTURE OF SECONDARY INTRAOCULAR LENS;  Surgeon: Alvia Norleen BIRCH, MD;  Location: [facility] OR;  Service: Ophthalmology;  Laterality: Left;   TOTAL HIP ARTHROPLASTY Left 07/02/2016   Procedure: LEFT TOTAL HIP ARTHROPLASTY ANTERIOR APPROACH;  Surgeon: Donnice Car, MD;  Location: [facility] ORS;  Service: Orthopedics;  Laterality: Left;   Patient Active Problem List   Diagnosis Date Noted   Dislocated IOL (intraocular lens), posterior, left 05/07/2022   Arthritis 04/02/2018   GERD (gastroesophageal reflux disease) 04/02/2018   Hyperlipidemia 04/02/2018   Osteoporosis, post-menopausal 04/02/2018   Carotid stenosis, bilateral 02/06/2018   S/P CABG x 3 02/06/2018   Coronary artery disease involving native coronary artery of native heart 01/22/2018   Angina decubitus 01/07/2018   S/P left THA, AA 07/02/2016   S/P hip replacement 07/02/2016    ONSET DATE: 11/19/23  REFERRING DIAG:  Diagnosis  R26.89 (ICD-10-CM) - Balance disorder    THERAPY DIAG:   Imbalance  Unsteadiness on feet  Balance disorder  Rationale for Evaluation and Treatment: Rehabilitation  SUBJECTIVE:  SUBJECTIVE STATEMENT: Patient reports doing okay- very busy[place] so haven't had much time for the exercises.    Pt reports that she fell in January as well as July. States that when she fell in July she broke her wrist and foot. Feels like her balance has been getting a little worse since she started taking Gabapentin  a few month ago.  Pt states that she did not wear post-op shoe following ankle metatarsal.   Pt accompanied by: self  PERTINENT HISTORY:   From recent hospitalization   7/2:  Patient is a 89 year old female who presented today following a fall.  Patient had laceration of her nose that was easily addressed with Dermabond, please see procedure note for full details.  Discussed wound care instructions with her.  Abrasions were cleaned by the nurse followed by placing Band-Aids on them.  Patient's right wrist pain resulted in x-ray which showed comminuted and mildly displaced distal radial fracture.  Patient was placed in a sugar-tong splint and sling.  Patient left foot pain resulted in x-ray which showed oblique mildly displaced distal fifth metatarsal shaft fracture.  Patient was placed in a postop shoe.  Patient was given follow-up information for orthopedics.   PAIN:  Are you having pain? No  PRECAUTIONS: Other: wearing Wrist brace on the RUE.   RED FLAGS: None   WEIGHT BEARING RESTRICTIONS: Yes WBAT for RUE  FALLS: Has patient fallen in last 6 months? Yes. Number of falls 1  LIVING ENVIRONMENT: Lives with: lives with their spouse Lives in: House/apartment Stairs: Yes: Internal: flight steps; on right going up and External: 3 steps; on right going up Has following equipment at home: None  PLOF: Independent, Independent with basic ADLs, Independent with household mobility without device, Independent with gait, and Independent with transfers  PATIENT GOALS: feel steadier with walking. Pick feet up batter with walking   OBJECTIVE:  Note: Objective measures were completed at Evaluation unless otherwise noted.  DIAGNOSTIC FINDINGS:   DG Wrist IMPRESSION: Comminuted and mildly displaced distal radial fracture with radiocarpal intra-articular extension.    COGNITION: Overall cognitive status: Within functional limits for tasks assessed   SENSATION: WFL  COORDINATION: ankle to knee: WFL   EDEMA:  WFL  MUSCLE TONE: WFL   POSTURE: forward head  LOWER EXTREMITY MMT  MMT  Right Eval Left Eval  Hip flexion 4- 4  Hip extension 4 4+   Hip abduction 4 4  Hip adduction 4+ 4+  Hip internal rotation    Hip external rotation    Knee flexion 4 4+  Knee extension 4+ 4+  Ankle dorsiflexion 4+ 4+  Ankle plantarflexion    Ankle inversion    Ankle eversion     (Blank rows = not tested)  LOWER EXTREMITY ROM    Grossly WFL  BED MOBILITY:  Findings: Sit to supine Complete Independence Supine to sit Complete Independence Rolling to Right Complete Independence Rolling to Left Complete Independence  TRANSFERS: Sit to stand: Complete Independence  Assistive device utilized: None     Stand to sit: Complete Independence and Mod A  Assistive device utilized: None     Chair to chair: Complete Independence  Assistive device utilized: None        CURB:  Findings: CGA for safety without UE support   STAIRS: Findings: Level of Assistance: Modified independence, Stair Negotiation Technique: Step to Pattern Alternating Pattern  with Bilateral Rails, Number of Stairs: 6, Height of Stairs:  6   , and Comments: performed half of descent with step to, then returned to t GAIT: Findings: Gait Characteristics: WFL, step through pattern, and narrow BOS, Distance walked: 70, Assistive device utilized:None, and Level of assistance: Complete Independence  FUNCTIONAL TESTS:  5 times sit to stand: 13.97 sec 6 minute walk test: to be completed  Berg Balance Scale:     Interpretation of scores: Non-Specific Older Adults Cutoff Score: <=22/30 = risk of falls Parkinson's Disease Cutoff score <15/30= fall risk (Hoehn & Yahr 1-4)  Minimally Clinically Important Difference (MCID)  Stroke (acute, subacute, and chronic) = MDC: 4.2 points Vestibular (acute) = MDC: 6 points Community Dwelling Older Adults =  MCID: 4 points Parkinson's Disease  =  MDC: 4.3 points  (Academy of Neurologic Physical Therapy (nd). Functional Gait Assessment. Retrieved from  https://www.neuropt.org/docs/default-source/cpgs/core-outcome-measures/function-gait-assessment-pocket-guide-proof9-(2).pdf?sfvrsn=b80f35043_0.)  PATIENT SURVEYS:  ABC scale: The Activities-Specific Balance Confidence (ABC) Scale 0% 10 20 30  40 50 60 70 80 90 100% No confidence<->completely confident  "How confident are you that you will not lose your balance or become unsteady when you . . .   Date tested   Walk around the house   2. Walk up or down stairs   3. Bend over and pick up a slipper from in front of a closet floor   4. Reach for a small can off a shelf at eye level   5. Stand on tip toes and reach for something above your head   6. Stand on a chair and reach for something   7. Sweep the floor   8. Walk outside the house to a car parked in the driveway   9. Get into or out of a car   10. Walk across a parking lot to the mall   11. Walk up or down a ramp   12. Walk in a crowded mall where people rapidly walk past you   13. Are bumped into by people as you walk through the mall   14. Step onto or off of an escalator while you are holding onto the railing   15. Step onto or off an escalator while holding onto parcels such that you cannot hold onto the railing   16. Walk outside on icy sidewalks   Total: #/16 84%                                                                                                                                 TREATMENT DATE: 02/13/2024   NMR:  - dynamic step up 5 block with opp LE high knee march at support bar- initially 5 reps with BUE support alt LE; progressed to 10 reps alt LE with 1UE support; then 10 more reps with no UE support alt LE. *unsteady and difficulty coordinating movement without UE support - minor LOB with CGA.   -Side stepping up from left side of 5 block then off on right side - repeat  back up using LLE x 5 back and forth with BUE Support and then 15 more without UE support (mild difficulty coordinating movement)    -Staggered standing (back LE on airex pad and front LE on 5 step) - initially just static hold x 30 sec x 2.  Progressed to horizontal head turns x 10 ea directions then brief standing rest break followed by 10 reps of vertical head motions. Observed mostly ankle righting reaction- more hips with vertical head motions.   -Dynamic high knee march in place on airex pad- VC to perform slowly to focus on balance. X 20 reps alt LE.   -Fwd/bwd walking - focusing erect posture and step length- counting steps (initially 11 fwd and 12 bwd) - progressed to 8 fwd/9 bwd after 5 round of walking 10 feet - down and backward.   TA:  Sit to stand with arms crossed then reaching diagonal overhead x 15 reps     PATIENT EDUCATION: Education details: Sensory components of balance, Education in [employer] Person educated: Patient Education method: Medical illustrator Education comprehension: verbalized understanding  HOME EXERCISE PROGRAM: Access Code: [email or web address] URL: [email or web address] Date: 02/06/2024 Prepared by: Reyes London  Exercises - Sit to Stand Without Arm Support  - 3 x weekly - 3 sets - 10 reps - Standing Tandem Balance with Counter Support  - 3 x weekly - 3 sets - up to 30 sec hold - Standing March  - 3 x weekly - 3 sets - 10 reps - 2 sec hold - Standing Balance in Corner with Eyes Closed  - 2 x weekly - 3 sets - 20 hold  GOALS: Goals reviewed with patient? Yes  SHORT TERM GOALS: Target date: 02/06/2024    Patient will be independent in home exercise program to improve strength/mobility for better functional independence with ADLs. Baseline: to be given at visit 2  Goal status: INITIAL   LONG TERM GOALS: Target date: 03/05/2024    Patient will increase ABC scale by 5 points % to demonstrate better functional mobility and better confidence with ADLs.  Baseline: 84% Goal status: INITIAL  2.  Patient (> 26 years old) will complete five times sit to  stand test in < 15 seconds indicating an increased LE strength and improved balance. Baseline:13.7 sec Goal status: INITIAL  3.  Patient will increase Berg Balance score by > 6 points to demonstrate decreased fall risk during functional activities Baseline: 45/56 Goal status: INITIAL  4.  Patient will increase 6 min walk test to >1000 feet to improve gait speed and distance for better community ambulation and to reduce fall risk. Baseline: to be assessed at visit 2; 02/06/2024= 875 Goal status: REVISED  5.  Patient will increase SLS time to >10 sec bil to improve safety with ADLs and community mobility to reduce fall risk and improve safety with stair management  Baseline: R 2 sec L 4 sec  Goal status: INITIAL  6.  Patient will increase FGA score by 4 points  as to demonstrate reduced fall risk and improved dynamic gait balance for better safety with community/home ambulation.  Baseline: 21 Goal status: INITIAL   ASSESSMENT:  CLINICAL IMPRESSION:     Patient is a 89 y.o. Female who was seen today for physical therapy  treatment for balance deficits with recent traumatic falls. Patient was challenged overall today with dynamic balance- some LOB with feet staggered and dynamic marching. She did show some adaptation to tasks improving with step length with fwd/bwd walking  and slower cadence with marching indicating improved balance.  Pt. will benefit from skilled PT to reduce fall risk and improve independence with all community ambulation.  OBJECTIVE IMPAIRMENTS: Abnormal gait, decreased balance, decreased knowledge of condition, decreased mobility, difficulty walking, and decreased strength.   ACTIVITY LIMITATIONS: squatting, stairs, locomotion level, and caring for others  PARTICIPATION LIMITATIONS: shopping, community activity, yard work, and church  PERSONAL FACTORS: Age and 1-2 comorbidities: OA and macualar degeneration are also affecting patient's functional outcome.   REHAB  POTENTIAL: Good  CLINICAL DECISION MAKING: Evolving/moderate complexity  EVALUATION COMPLEXITY: Moderate  PLAN:  PT FREQUENCY: 1-2x/week  PT DURATION: 12 weeks  PLANNED INTERVENTIONS: [id number]- PT Re-evaluation, 97750- Physical Performance Testing, 97110-Therapeutic exercises, 97530- Therapeutic activity, 97112- Neuromuscular re-education, 97535- Self Care, [id number]- Manual therapy, [id number]- Gait training, [phone]- Canalith repositioning, Patient/Family education, Balance training, Stair training, Joint mobilization, Joint manipulation, Vestibular training, Visual/preceptual remediation/compensation, DME instructions, Cryotherapy, and Moist heat  PLAN FOR NEXT SESSION:    Progress  balance and generalized LE strengthening     Reyes LOISE London, PT Physical Therapist  [facility] [phone] 02/13/2024, 10:01 AM

## 2024-02-13 ENCOUNTER — Ambulatory Visit

## 2024-02-13 ENCOUNTER — Ambulatory Visit: Admitting: Physical Therapy

## 2024-02-13 DIAGNOSIS — R2681 Unsteadiness on feet: Secondary | ICD-10-CM

## 2024-02-13 DIAGNOSIS — R2689 Other abnormalities of gait and mobility: Secondary | ICD-10-CM

## 2024-02-18 ENCOUNTER — Ambulatory Visit: Attending: Neurology

## 2024-02-18 DIAGNOSIS — R2681 Unsteadiness on feet: Secondary | ICD-10-CM | POA: Diagnosis present

## 2024-02-18 DIAGNOSIS — R2689 Other abnormalities of gait and mobility: Secondary | ICD-10-CM | POA: Insufficient documentation

## 2024-02-18 NOTE — Therapy (Signed)
 OUTPATIENT PHYSICAL THERAPY NEURO TREATMENT   Patient Name: Darlene Barry MRN: [id number] DOB:[birth year], 89 y.o., female Today's Date: 02/19/2024   PCP: Valora Agent, MD  REFERRING PROVIDER: Maree Jannett POUR, MD   END OF SESSION:    PT End of Session - 02/18/24 0938     Visit Number 4    Number of Visits 8    Date for Recertification  03/05/24    Progress Note Due on Visit 10    PT Start Time 0932    PT Stop Time 1014    PT Time Calculation (min) 42 min    Equipment Utilized During Treatment Gait belt    Activity Tolerance Patient tolerated treatment well    Behavior During Therapy [facility] for tasks assessed/performed            Past Medical History:  Diagnosis Date   Arthritis    Coronary artery disease    CABG 01/2018   Detached retina    Fibrocystic breast disease    GERD (gastroesophageal reflux disease)    Hyperlipidemia    Macular degeneration    Osteoporosis    Past Surgical History:  Procedure Laterality Date   25 GAUGE PARS PLANA VITRECTOMY WITH 20 GAUGE MVR PORT Left 05/07/2022   Procedure: 25 GAUGE PARS PLANA VITRECTOMY WITH 20 GAUGE MVR PORT;  Surgeon: Alvia Norleen BIRCH, MD;  Location: [facility] OR;  Service: Ophthalmology;  Laterality: Left;   BREAST EXCISIONAL BIOPSY Right 1970   NEG   CATARACT EXTRACTION, BILATERAL     CORONARY ARTERY BYPASS GRAFT     01/2018   HIP SURGERY     fractured hip, has screws in place   INTRAOCULAR LENS REMOVAL Left 05/07/2022   Procedure: REMOVAL OF INTRAOCULAR LENS;  Surgeon: Alvia Norleen BIRCH, MD;  Location: [facility] OR;  Service: Ophthalmology;  Laterality: Left;   LASER PHOTO ABLATION Left 05/07/2022   Procedure: LASER PHOTO ABLATION;  Surgeon: Alvia Norleen BIRCH, MD;  Location: [facility] OR;  Service: Ophthalmology;  Laterality: Left;   LEFT HEART CATH AND CORONARY ANGIOGRAPHY Left 01/14/2018   Procedure: LEFT HEART CATH AND CORONARY ANGIOGRAPHY;  Surgeon: Hester Wolm PARAS, MD;  Location: [facility] INVASIVE CV LAB;  Service: Cardiovascular;   Laterality: Left;   PLACEMENT AND SUTURE OF SECONDARY INTRAOCULAR LENS Left 05/07/2022   Procedure: PLACEMENT AND SUTURE OF SECONDARY INTRAOCULAR LENS;  Surgeon: Alvia Norleen BIRCH, MD;  Location: [facility] OR;  Service: Ophthalmology;  Laterality: Left;   TOTAL HIP ARTHROPLASTY Left 07/02/2016   Procedure: LEFT TOTAL HIP ARTHROPLASTY ANTERIOR APPROACH;  Surgeon: Donnice Car, MD;  Location: [facility] ORS;  Service: Orthopedics;  Laterality: Left;   Patient Active Problem List   Diagnosis Date Noted   Dislocated IOL (intraocular lens), posterior, left 05/07/2022   Arthritis 04/02/2018   GERD (gastroesophageal reflux disease) 04/02/2018   Hyperlipidemia 04/02/2018   Osteoporosis, post-menopausal 04/02/2018   Carotid stenosis, bilateral 02/06/2018   S/P CABG x 3 02/06/2018   Coronary artery disease involving native coronary artery of native heart 01/22/2018   Angina decubitus 01/07/2018   S/P left THA, AA 07/02/2016   S/P hip replacement 07/02/2016    ONSET DATE: 11/19/23  REFERRING DIAG:  Diagnosis  R26.89 (ICD-10-CM) - Balance disorder    THERAPY DIAG:   Imbalance  Unsteadiness on feet  Balance disorder  Rationale for Evaluation and Treatment: Rehabilitation  SUBJECTIVE:  SUBJECTIVE STATEMENT: Patient reports doing okay- tired and overwhelmed after recent loss of her husband but managing.     Pt reports that she fell in January as well as July. States that when she fell in July she broke her wrist and foot. Feels like her balance has been getting a little worse since she started taking Gabapentin  a few month ago.  Pt states that she did not wear post-op shoe following ankle metatarsal.   Pt accompanied by: self  PERTINENT HISTORY:   From recent hospitalization  7/2:  Patient is a 89 year old female  who presented today following a fall.  Patient had laceration of her nose that was easily addressed with Dermabond, please see procedure note for full details.  Discussed wound care instructions with her.  Abrasions were cleaned by the nurse followed by placing Band-Aids on them.  Patient's right wrist pain resulted in x-ray which showed comminuted and mildly displaced distal radial fracture.  Patient was placed in a sugar-tong splint and sling.  Patient left foot pain resulted in x-ray which showed oblique mildly displaced distal fifth metatarsal shaft fracture.  Patient was placed in a postop shoe.  Patient was given follow-up information for orthopedics.   PAIN:  Are you having pain? No  PRECAUTIONS: Other: wearing Wrist brace on the RUE.   RED FLAGS: None   WEIGHT BEARING RESTRICTIONS: Yes WBAT for RUE  FALLS: Has patient fallen in last 6 months? Yes. Number of falls 1  LIVING ENVIRONMENT: Lives with: lives with their spouse Lives in: House/apartment Stairs: Yes: Internal: flight steps; on right going up and External: 3 steps; on right going up Has following equipment at home: None  PLOF: Independent, Independent with basic ADLs, Independent with household mobility without device, Independent with gait, and Independent with transfers  PATIENT GOALS: feel steadier with walking. Pick feet up batter with walking   OBJECTIVE:  Note: Objective measures were completed at Evaluation unless otherwise noted.  DIAGNOSTIC FINDINGS:   DG Wrist IMPRESSION: Comminuted and mildly displaced distal radial fracture with radiocarpal intra-articular extension.    COGNITION: Overall cognitive status: Within functional limits for tasks assessed   SENSATION: WFL  COORDINATION: ankle to knee: WFL   EDEMA:  WFL  MUSCLE TONE: WFL   POSTURE: forward head  LOWER EXTREMITY MMT  MMT  Right Eval Left Eval  Hip flexion 4- 4  Hip extension 4 4+  Hip abduction 4 4  Hip adduction 4+ 4+   Hip internal rotation    Hip external rotation    Knee flexion 4 4+  Knee extension 4+ 4+  Ankle dorsiflexion 4+ 4+  Ankle plantarflexion    Ankle inversion    Ankle eversion     (Blank rows = not tested)  LOWER EXTREMITY ROM    Grossly WFL  BED MOBILITY:  Findings: Sit to supine Complete Independence Supine to sit Complete Independence Rolling to Right Complete Independence Rolling to Left Complete Independence  TRANSFERS: Sit to stand: Complete Independence  Assistive device utilized: None     Stand to sit: Complete Independence and Mod A  Assistive device utilized: None     Chair to chair: Complete Independence  Assistive device utilized: None        CURB:  Findings: CGA for safety without UE support   STAIRS: Findings: Level of Assistance: Modified independence, Stair Negotiation Technique: Step to Pattern Alternating Pattern  with Bilateral Rails, Number of Stairs: 6, Height of Stairs: 6   , and Comments: performed  half of descent with step to, then returned to t GAIT: Findings: Gait Characteristics: WFL, step through pattern, and narrow BOS, Distance walked: 70, Assistive device utilized:None, and Level of assistance: Complete Independence  FUNCTIONAL TESTS:  5 times sit to stand: 13.97 sec 6 minute walk test: to be completed  Berg Balance Scale:     Interpretation of scores: Non-Specific Older Adults Cutoff Score: <=22/30 = risk of falls Parkinson's Disease Cutoff score <15/30= fall risk (Hoehn & Yahr 1-4)  Minimally Clinically Important Difference (MCID)  Stroke (acute, subacute, and chronic) = MDC: 4.2 points Vestibular (acute) = MDC: 6 points Community Dwelling Older Adults =  MCID: 4 points Parkinson's Disease  =  MDC: 4.3 points  (Academy of Neurologic Physical Therapy (nd). Functional Gait Assessment. Retrieved from  https://www.neuropt.org/docs/default-source/cpgs/core-outcome-measures/function-gait-assessment-pocket-guide-proof9-(2).pdf?sfvrsn=b59f35043_0.)  PATIENT SURVEYS:  ABC scale: The Activities-Specific Balance Confidence (ABC) Scale 0% 10 20 30  40 50 60 70 80 90 100% No confidence<->completely confident  "How confident are you that you will not lose your balance or become unsteady when you . . .   Date tested   Walk around the house   2. Walk up or down stairs   3. Bend over and pick up a slipper from in front of a closet floor   4. Reach for a small can off a shelf at eye level   5. Stand on tip toes and reach for something above your head   6. Stand on a chair and reach for something   7. Sweep the floor   8. Walk outside the house to a car parked in the driveway   9. Get into or out of a car   10. Walk across a parking lot to the mall   11. Walk up or down a ramp   12. Walk in a crowded mall where people rapidly walk past you   13. Are bumped into by people as you walk through the mall   14. Step onto or off of an escalator while you are holding onto the railing   15. Step onto or off an escalator while holding onto parcels such that you cannot hold onto the railing   16. Walk outside on icy sidewalks   Total: #/16 84%                                                                                                                                 TREATMENT DATE: 02/18/2024   NMR:  - dynamic step up 5 block  x 20 reps alt LE (min difficulty coordinating activity).   -Side stepping up/over 1/2 foam rolls (4) in // bars- down and back x 15 without any UE Support    -Fwd walking up/over 1/2 foam rolls (4) in // bars- down and back x 10 without any UE support  Activity Description: positioned 6 pods on floor 10 feet  x6 rectangle- tapping pods with fwd/bwd/side/diagonal stepping with trunk rotation  to look for pods Activity Setting:  The Blaze Pod Random setting was chosen to enhance  cognitive processing and agility, providing an unpredictable environment to simulate real-world scenarios, and fostering quick reactions and adaptability.   Number of Pods:  6 Cycles/Sets:  8 Duration (Time or Hit Count):  60 sec Patient Stats - Patient demo between 11-16 hits with improving reaction and adaption to task.   TA:   The patient completed 5 minutes at level(s) 1-3 on the NuStep using both BUE/BLE reciprocal movements to promote strength, endurance, and cardiorespiratory fitness. PT increased the resistance level and monitored the patient's response to the intervention throughout. The patient required min cueing for technique and SBA level of assistance.    Sit to stand with arms crossed then reaching diagonal overhead 2 x 10 reps (mild unsteadiness initially and fatigue on last couple of reps on 2nd set)   TE:  Leg press 40# 2x 10 reps BLE with VC to perform slow eccentric    PATIENT EDUCATION: Education details: Sensory components of balance, Education in [employer] Person educated: Patient Education method: Medical illustrator Education comprehension: verbalized understanding  HOME EXERCISE PROGRAM: Access Code: [email or web address] URL: [email or web address] Date: 02/06/2024 Prepared by: Reyes London  Exercises - Sit to Stand Without Arm Support  - 3 x weekly - 3 sets - 10 reps - Standing Tandem Balance with Counter Support  - 3 x weekly - 3 sets - up to 30 sec hold - Standing March  - 3 x weekly - 3 sets - 10 reps - 2 sec hold - Standing Balance in Corner with Eyes Closed  - 2 x weekly - 3 sets - 20 hold  GOALS: Goals reviewed with patient? Yes  SHORT TERM GOALS: Target date: 02/06/2024    Patient will be independent in home exercise program to improve strength/mobility for better functional independence with ADLs. Baseline: to be given at visit 2  Goal status: INITIAL   LONG TERM GOALS: Target date: 03/05/2024    Patient will increase  ABC scale by 5 points % to demonstrate better functional mobility and better confidence with ADLs.  Baseline: 84% Goal status: INITIAL  2.  Patient (> 40 years old) will complete five times sit to stand test in < 15 seconds indicating an increased LE strength and improved balance. Baseline:13.7 sec Goal status: INITIAL  3.  Patient will increase Berg Balance score by > 6 points to demonstrate decreased fall risk during functional activities Baseline: 45/56 Goal status: INITIAL  4.  Patient will increase 6 min walk test to >1000 feet to improve gait speed and distance for better community ambulation and to reduce fall risk. Baseline: to be assessed at visit 2; 02/06/2024= 875 Goal status: REVISED  5.  Patient will increase SLS time to >10 sec bil to improve safety with ADLs and community mobility to reduce fall risk and improve safety with stair management  Baseline: R 2 sec L 4 sec  Goal status: INITIAL  6.  Patient will increase FGA score by 4 points  as to demonstrate reduced fall risk and improved dynamic gait balance for better safety with community/home ambulation.  Baseline: 21 Goal status: INITIAL   ASSESSMENT:  CLINICAL IMPRESSION:     Patient is a 89 y.o. Female who was seen today for physical therapy  treatment for balance deficits with recent traumatic falls. Patient continues to progress well- fatigued with dynamic balance using blaze pods today but able to adapt to task with improved reaction  time and steadiness exhibited today. No pain with leg press but did exhibit some fatigue overall with last set and 2nd set of sit to stands. She will continue to benefit from further LE strengthening for improved overall stability with all functional mobility  Pt. will benefit from skilled PT to reduce fall risk and improve independence with all community ambulation.  OBJECTIVE IMPAIRMENTS: Abnormal gait, decreased balance, decreased knowledge of condition, decreased mobility,  difficulty walking, and decreased strength.   ACTIVITY LIMITATIONS: squatting, stairs, locomotion level, and caring for others  PARTICIPATION LIMITATIONS: shopping, community activity, yard work, and church  PERSONAL FACTORS: Age and 1-2 comorbidities: OA and macualar degeneration are also affecting patient's functional outcome.   REHAB POTENTIAL: Good  CLINICAL DECISION MAKING: Evolving/moderate complexity  EVALUATION COMPLEXITY: Moderate  PLAN:  PT FREQUENCY: 1-2x/week  PT DURATION: 12 weeks  PLANNED INTERVENTIONS: [id number]- PT Re-evaluation, 97750- Physical Performance Testing, 97110-Therapeutic exercises, 97530- Therapeutic activity, [id number]- Neuromuscular re-education, 97535- Self Care, [id number]- Manual therapy, [phone]- Gait training, [phone]- Canalith repositioning, Patient/Family education, Balance training, Stair training, Joint mobilization, Joint manipulation, Vestibular training, Visual/preceptual remediation/compensation, DME instructions, Cryotherapy, and Moist heat  PLAN FOR NEXT SESSION:   Add more dual task activities next visit.  Progress  balance and generalized LE strengthening     Reyes LOISE London, PT Physical Therapist  [facility] [phone] 02/19/2024, 2:50 PM

## 2024-02-20 ENCOUNTER — Ambulatory Visit: Admitting: Physical Therapy

## 2024-02-25 ENCOUNTER — Ambulatory Visit: Admitting: Physical Therapy

## 2024-02-25 DIAGNOSIS — R2689 Other abnormalities of gait and mobility: Secondary | ICD-10-CM | POA: Diagnosis not present

## 2024-02-25 DIAGNOSIS — R2681 Unsteadiness on feet: Secondary | ICD-10-CM

## 2024-02-25 NOTE — Therapy (Signed)
 OUTPATIENT PHYSICAL THERAPY NEURO TREATMENT   Patient Name: CASSIDEY BARRALES MRN: [id number] DOB:11-19-1931, 89 y.o., female Today's Date: 02/25/2024   PCP: Valora Agent, MD  REFERRING PROVIDER: Maree Jannett POUR, MD   END OF SESSION:    PT End of Session - 02/25/24 1105     Visit Number 5    Number of Visits 8    Date for Recertification  03/05/24    Progress Note Due on Visit 10    PT Start Time 1105    PT Stop Time 1145    PT Time Calculation (min) 40 min    Equipment Utilized During Treatment Gait belt    Activity Tolerance Patient tolerated treatment well;No increased pain    Behavior During Therapy [facility] for tasks assessed/performed             Past Medical History:  Diagnosis Date   Arthritis    Coronary artery disease    CABG 01/2018   Detached retina    Fibrocystic breast disease    GERD (gastroesophageal reflux disease)    Hyperlipidemia    Macular degeneration    Osteoporosis    Past Surgical History:  Procedure Laterality Date   25 GAUGE PARS PLANA VITRECTOMY WITH 20 GAUGE MVR PORT Left 05/07/2022   Procedure: 25 GAUGE PARS PLANA VITRECTOMY WITH 20 GAUGE MVR PORT;  Surgeon: Alvia Norleen BIRCH, MD;  Location: [facility] OR;  Service: Ophthalmology;  Laterality: Left;   BREAST EXCISIONAL BIOPSY Right 1970   NEG   CATARACT EXTRACTION, BILATERAL     CORONARY ARTERY BYPASS GRAFT     01/2018   HIP SURGERY     fractured hip, has screws in place   INTRAOCULAR LENS REMOVAL Left 05/07/2022   Procedure: REMOVAL OF INTRAOCULAR LENS;  Surgeon: Alvia Norleen BIRCH, MD;  Location: [facility] OR;  Service: Ophthalmology;  Laterality: Left;   LASER PHOTO ABLATION Left 05/07/2022   Procedure: LASER PHOTO ABLATION;  Surgeon: Alvia Norleen BIRCH, MD;  Location: [facility] OR;  Service: Ophthalmology;  Laterality: Left;   LEFT HEART CATH AND CORONARY ANGIOGRAPHY Left 01/14/2018   Procedure: LEFT HEART CATH AND CORONARY ANGIOGRAPHY;  Surgeon: Hester Wolm PARAS, MD;  Location: [facility] INVASIVE CV LAB;   Service: Cardiovascular;  Laterality: Left;   PLACEMENT AND SUTURE OF SECONDARY INTRAOCULAR LENS Left 05/07/2022   Procedure: PLACEMENT AND SUTURE OF SECONDARY INTRAOCULAR LENS;  Surgeon: Alvia Norleen BIRCH, MD;  Location: [facility] OR;  Service: Ophthalmology;  Laterality: Left;   TOTAL HIP ARTHROPLASTY Left 07/02/2016   Procedure: LEFT TOTAL HIP ARTHROPLASTY ANTERIOR APPROACH;  Surgeon: Donnice Car, MD;  Location: [facility] ORS;  Service: Orthopedics;  Laterality: Left;   Patient Active Problem List   Diagnosis Date Noted   Dislocated IOL (intraocular lens), posterior, left 05/07/2022   Arthritis 04/02/2018   GERD (gastroesophageal reflux disease) 04/02/2018   Hyperlipidemia 04/02/2018   Osteoporosis, post-menopausal 04/02/2018   Carotid stenosis, bilateral 02/06/2018   S/P CABG x 3 02/06/2018   Coronary artery disease involving native coronary artery of native heart 01/22/2018   Angina decubitus 01/07/2018   S/P left THA, AA 07/02/2016   S/P hip replacement 07/02/2016    ONSET DATE: 11/19/23  REFERRING DIAG:  Diagnosis  R26.89 (ICD-10-CM) - Balance disorder    THERAPY DIAG:   Imbalance  Unsteadiness on feet  Balance disorder  Rationale for Evaluation and Treatment: Rehabilitation  SUBJECTIVE:  SUBJECTIVE STATEMENT:  Pt reports she feels like she is still trying to get things together after recent loss of her husband, but she is managing. Pt states that she has [place] closer to her daughters.   Dermatologist appointment set for Friday 02/27/24.     From initial eval:  Pt reports that she fell in January as well as July. States that when she fell in July she broke her wrist and foot. Feels like her balance has been getting a little worse since she started taking  Gabapentin  a few month ago.  Pt states that she did not wear post-op shoe following ankle metatarsal.   Pt accompanied by: self  PERTINENT HISTORY:   From recent hospitalization  7/2:  Patient is a 89 year old female who presented today following a fall.  Patient had laceration of her nose that was easily addressed with Dermabond, please see procedure note for full details.  Discussed wound care instructions with her.  Abrasions were cleaned by the nurse followed by placing Band-Aids on them.  Patient's right wrist pain resulted in x-ray which showed comminuted and mildly displaced distal radial fracture.  Patient was placed in a sugar-tong splint and sling.  Patient left foot pain resulted in x-ray which showed oblique mildly displaced distal fifth metatarsal shaft fracture.  Patient was placed in a postop shoe.  Patient was given follow-up information for orthopedics.   PAIN:  Are you having pain? No  PRECAUTIONS: Other: wearing Wrist brace on the RUE.   RED FLAGS: None   WEIGHT BEARING RESTRICTIONS: Yes WBAT for RUE  FALLS: Has patient fallen in last 6 months? Yes. Number of falls 1  LIVING ENVIRONMENT: Lives with: lives with their spouse Lives in: House/apartment Stairs: Yes: Internal: flight steps; on right going up and External: 3 steps; on right going up Has following equipment at home: None  PLOF: Independent, Independent with basic ADLs, Independent with household mobility without device, Independent with gait, and Independent with transfers  PATIENT GOALS: feel steadier with walking. Pick feet up batter with walking   OBJECTIVE:  Note: Objective measures were completed at Evaluation unless otherwise noted.  DIAGNOSTIC FINDINGS:   DG Wrist IMPRESSION: Comminuted and mildly displaced distal radial fracture with radiocarpal intra-articular extension.    COGNITION: Overall cognitive status: Within functional limits for tasks  assessed   SENSATION: WFL  COORDINATION: ankle to knee: WFL   EDEMA:  WFL  MUSCLE TONE: WFL   POSTURE: forward head  LOWER EXTREMITY MMT  MMT  Right Eval Left Eval  Hip flexion 4- 4  Hip extension 4 4+  Hip abduction 4 4  Hip adduction 4+ 4+  Hip internal rotation    Hip external rotation    Knee flexion 4 4+  Knee extension 4+ 4+  Ankle dorsiflexion 4+ 4+  Ankle plantarflexion    Ankle inversion    Ankle eversion     (Blank rows = not tested)  LOWER EXTREMITY ROM    Grossly WFL  BED MOBILITY:  Findings: Sit to supine Complete Independence Supine to sit Complete Independence Rolling to Right Complete Independence Rolling to Left Complete Independence  TRANSFERS: Sit to stand: Complete Independence  Assistive device utilized: None     Stand to sit: Complete Independence and Mod A  Assistive device utilized: None     Chair to chair: Complete Independence  Assistive device utilized: None        CURB:  Findings: CGA  for safety without UE support   STAIRS: Findings: Level of Assistance: Modified independence, Stair Negotiation Technique: Step to Pattern Alternating Pattern  with Bilateral Rails, Number of Stairs: 6, Height of Stairs: 6   , and Comments: performed half of descent with step to, then returned to t GAIT: Findings: Gait Characteristics: WFL, step through pattern, and narrow BOS, Distance walked: 70, Assistive device utilized:None, and Level of assistance: Complete Independence  FUNCTIONAL TESTS:  5 times sit to stand: 13.97 sec 6 minute walk test: to be completed  Berg Balance Scale:     Interpretation of scores: Non-Specific Older Adults Cutoff Score: <=22/30 = risk of falls Parkinson's Disease Cutoff score <15/30= fall risk (Hoehn & Yahr 1-4)  Minimally Clinically Important Difference (MCID)  Stroke (acute, subacute, and chronic) = MDC: 4.2 points Vestibular (acute) = MDC: 6 points Community Dwelling Older Adults =  MCID: 4  points Parkinson's Disease  =  MDC: 4.3 points  (Academy of Neurologic Physical Therapy (nd). Functional Gait Assessment. Retrieved from https://www.neuropt.org/docs/default-source/cpgs/core-outcome-measures/function-gait-assessment-pocket-guide-proof9-(2).pdf?sfvrsn=b41f35043_0.)  PATIENT SURVEYS:  ABC scale: The Activities-Specific Balance Confidence (ABC) Scale 0% 10 20 30  40 50 60 70 80 90 100% No confidence<->completely confident  "How confident are you that you will not lose your balance or become unsteady when you . . .   Date tested   Walk around the house   2. Walk up or down stairs   3. Bend over and pick up a slipper from in front of a closet floor   4. Reach for a small can off a shelf at eye level   5. Stand on tip toes and reach for something above your head   6. Stand on a chair and reach for something   7. Sweep the floor   8. Walk outside the house to a car parked in the driveway   9. Get into or out of a car   10. Walk across a parking lot to the mall   11. Walk up or down a ramp   12. Walk in a crowded mall where people rapidly walk past you   13. Are bumped into by people as you walk through the mall   14. Step onto or off of an escalator while you are holding onto the railing   15. Step onto or off an escalator while holding onto parcels such that you cannot hold onto the railing   16. Walk outside on icy sidewalks   Total: #/16 84%                                                                                                                                 TREATMENT DATE: 02/18/2024 Unless otherwise noted, at least CGA provided & gait belt donned.   Nustep: 6 min 30 sec, rolling hills, 1-4 resistance level, using both BUE/BLE reciprocal movements to promote strength, endurance, and cardiorespiratory fitness. PT monitored the patient's response to the intervention throughout,  SBA. STS w 3 # db press up 15x Side stepping up/over 4x 1/2 foam rolls in // bars -  down & back 5x without UE support; verbal cueing for increased step length Added 2x 3# AWs, additional 5x; continued to provide verbal cueing for increased step length Gait with head turns to look at cards, 5x down & back length of hallway Of note, pt reporting unfamiliarity with card suits; pivoted to naming numbers/face cards & color of suit.  Pt with some pathway deviations, decreased gait speed throughout activity Verbal cueing for increased gait speed & continuation of activity Retro ambulation 3x down & back length of hallway Pt reporting some fear with activity Verbal cueing for increased gait speed throughout On 2nd lap, added naming numbers/face cards & suit color of cards.  Dynamic gait with random turns in hallway both directions >250'  Verbal cueing to increase speed of turn, continue ambulation immediately after turn         PATIENT EDUCATION: Education details: Sensory components of balance, Education in HEP Person educated: Patient Education method: Medical illustrator Education comprehension: verbalized understanding  HOME EXERCISE PROGRAM: Access Code: [email or web address] URL: [email or web address] Date: 02/06/2024 Prepared by: Reyes London  Exercises - Sit to Stand Without Arm Support  - 3 x weekly - 3 sets - 10 reps - Standing Tandem Balance with Counter Support  - 3 x weekly - 3 sets - up to 30 sec hold - Standing March  - 3 x weekly - 3 sets - 10 reps - 2 sec hold - Standing Balance in Corner with Eyes Closed  - 2 x weekly - 3 sets - 20 hold  GOALS: Goals reviewed with patient? Yes  SHORT TERM GOALS: Target date: 02/06/2024    Patient will be independent in home exercise program to improve strength/mobility for better functional independence with ADLs. Baseline: to be given at visit 2  Goal status: INITIAL   LONG TERM GOALS: Target date: 03/05/2024    Patient will increase ABC scale by 5 points % to demonstrate better  functional mobility and better confidence with ADLs.  Baseline: 84% Goal status: INITIAL  2.  Patient (> 47 years old) will complete five times sit to stand test in < 15 seconds indicating an increased LE strength and improved balance. Baseline:13.7 sec Goal status: INITIAL  3.  Patient will increase Berg Balance score by > 6 points to demonstrate decreased fall risk during functional activities Baseline: 45/56 Goal status: INITIAL  4.  Patient will increase 6 min walk test to >1000 feet to improve gait speed and distance for better community ambulation and to reduce fall risk. Baseline: to be assessed at visit 2; 02/06/2024= 875 Goal status: REVISED  5.  Patient will increase SLS time to >10 sec bil to improve safety with ADLs and community mobility to reduce fall risk and improve safety with stair management  Baseline: R 2 sec L 4 sec  Goal status: INITIAL  6.  Patient will increase FGA score by 4 points  as to demonstrate reduced fall risk and improved dynamic gait balance for better safety with community/home ambulation.  Baseline: 21 Goal status: INITIAL   ASSESSMENT:  CLINICAL IMPRESSION:  Patient is a 89 y.o. Female who was seen today for physical therapy  treatment for balance deficits with recent traumatic falls. Patient continues to progress well- fatigued with dynamic balance with head turns, retro ambulation, & turning today but able to adapt to task with improved steadiness exhibited. Of note,  did make modification to gait with head turns to look at cards as pt verbalizing unfamiliarity with suits; instead, named color of suit & number/face card. Pt. will benefit from skilled PT to reduce fall risk and improve independence with all community ambulation.  OBJECTIVE IMPAIRMENTS: Abnormal gait, decreased balance, decreased knowledge of condition, decreased mobility, difficulty walking, and decreased strength.   ACTIVITY LIMITATIONS: squatting, stairs, locomotion level, and  caring for others  PARTICIPATION LIMITATIONS: shopping, community activity, yard work, and church  PERSONAL FACTORS: Age and 1-2 comorbidities: OA and macualar degeneration are also affecting patient's functional outcome.   REHAB POTENTIAL: Good  CLINICAL DECISION MAKING: Evolving/moderate complexity  EVALUATION COMPLEXITY: Moderate  PLAN:  PT FREQUENCY: 1-2x/week  PT DURATION: 12 weeks  PLANNED INTERVENTIONS: [id number]- PT Re-evaluation, 97750- Physical Performance Testing, 97110-Therapeutic exercises, 97530- Therapeutic activity, 97112- Neuromuscular re-education, 97535- Self Care, [id number]- Manual therapy, [phone]- Gait training, [phone]- Canalith repositioning, Patient/Family education, Balance training, Stair training, Joint mobilization, Joint manipulation, Vestibular training, Visual/preceptual remediation/compensation, DME instructions, Cryotherapy, and Moist heat  PLAN FOR NEXT SESSION:   Continue dual task activities  Progress  balance and generalized LE strengthening     Chiquita Silvan, Student-PT Chiquita Silvan, SPT Physical Therapy Student - [facility]  02/25/2024, 12:02 PM

## 2024-02-27 ENCOUNTER — Ambulatory Visit: Admitting: Physical Therapy

## 2024-03-02 ENCOUNTER — Ambulatory Visit: Admitting: Physical Therapy

## 2024-03-02 DIAGNOSIS — R2689 Other abnormalities of gait and mobility: Secondary | ICD-10-CM | POA: Diagnosis not present

## 2024-03-02 DIAGNOSIS — R2681 Unsteadiness on feet: Secondary | ICD-10-CM

## 2024-03-02 NOTE — Therapy (Signed)
 OUTPATIENT PHYSICAL THERAPY NEURO TREATMENT   Patient Name: Darlene Barry MRN: [id number] DOB:05-30-1931, 89 y.o., female Today's Date: 03/02/2024   PCP: Valora Agent, MD  REFERRING PROVIDER: Maree Jannett POUR, MD   END OF SESSION:    PT End of Session - 03/02/24 1406     Visit Number 6    Number of Visits 8    Date for Recertification  03/05/24    Progress Note Due on Visit 10    PT Start Time 1405    PT Stop Time 1445    PT Time Calculation (min) 40 min    Equipment Utilized During Treatment Gait belt    Activity Tolerance Patient tolerated treatment well;No increased pain    Behavior During Therapy [facility] for tasks assessed/performed             Past Medical History:  Diagnosis Date   Arthritis    Coronary artery disease    CABG 01/2018   Detached retina    Fibrocystic breast disease    GERD (gastroesophageal reflux disease)    Hyperlipidemia    Macular degeneration    Osteoporosis    Past Surgical History:  Procedure Laterality Date   25 GAUGE PARS PLANA VITRECTOMY WITH 20 GAUGE MVR PORT Left 05/07/2022   Procedure: 25 GAUGE PARS PLANA VITRECTOMY WITH 20 GAUGE MVR PORT;  Surgeon: Alvia Norleen BIRCH, MD;  Location: [facility] OR;  Service: Ophthalmology;  Laterality: Left;   BREAST EXCISIONAL BIOPSY Right 1970   NEG   CATARACT EXTRACTION, BILATERAL     CORONARY ARTERY BYPASS GRAFT     01/2018   HIP SURGERY     fractured hip, has screws in place   INTRAOCULAR LENS REMOVAL Left 05/07/2022   Procedure: REMOVAL OF INTRAOCULAR LENS;  Surgeon: Alvia Norleen BIRCH, MD;  Location: [facility] OR;  Service: Ophthalmology;  Laterality: Left;   LASER PHOTO ABLATION Left 05/07/2022   Procedure: LASER PHOTO ABLATION;  Surgeon: Alvia Norleen BIRCH, MD;  Location: [facility] OR;  Service: Ophthalmology;  Laterality: Left;   LEFT HEART CATH AND CORONARY ANGIOGRAPHY Left 01/14/2018   Procedure: LEFT HEART CATH AND CORONARY ANGIOGRAPHY;  Surgeon: Hester Wolm PARAS, MD;  Location: [facility] INVASIVE CV LAB;   Service: Cardiovascular;  Laterality: Left;   PLACEMENT AND SUTURE OF SECONDARY INTRAOCULAR LENS Left 05/07/2022   Procedure: PLACEMENT AND SUTURE OF SECONDARY INTRAOCULAR LENS;  Surgeon: Alvia Norleen BIRCH, MD;  Location: [facility] OR;  Service: Ophthalmology;  Laterality: Left;   TOTAL HIP ARTHROPLASTY Left 07/02/2016   Procedure: LEFT TOTAL HIP ARTHROPLASTY ANTERIOR APPROACH;  Surgeon: Donnice Car, MD;  Location: [facility] ORS;  Service: Orthopedics;  Laterality: Left;   Patient Active Problem List   Diagnosis Date Noted   Dislocated IOL (intraocular lens), posterior, left 05/07/2022   Arthritis 04/02/2018   GERD (gastroesophageal reflux disease) 04/02/2018   Hyperlipidemia 04/02/2018   Osteoporosis, post-menopausal 04/02/2018   Carotid stenosis, bilateral 02/06/2018   S/P CABG x 3 02/06/2018   Coronary artery disease involving native coronary artery of native heart 01/22/2018   Angina decubitus 01/07/2018   S/P left THA, AA 07/02/2016   S/P hip replacement 07/02/2016    ONSET DATE: 11/19/23  REFERRING DIAG:  Diagnosis  R26.89 (ICD-10-CM) - Balance disorder    THERAPY DIAG:   Imbalance  Unsteadiness on feet  Balance disorder  Rationale for Evaluation and Treatment: Rehabilitation  SUBJECTIVE:  SUBJECTIVE STATEMENT:    Reports that she is doing well. No significant pain reported today. Reports that she feels like she is ready to d/c from PT. [place] be closer to her daughter.     From initial eval:  Pt reports that she fell in January as well as July. States that when she fell in July she broke her wrist and foot. Feels like her balance has been getting a little worse since she started taking Gabapentin  a few month ago.  Pt states that she did  not wear post-op shoe following ankle metatarsal.   Pt accompanied by: self  PERTINENT HISTORY:   From recent hospitalization  7/2:  Patient is a 89 year old female who presented today following a fall.  Patient had laceration of her nose that was easily addressed with Dermabond, please see procedure note for full details.  Discussed wound care instructions with her.  Abrasions were cleaned by the nurse followed by placing Band-Aids on them.  Patient's right wrist pain resulted in x-ray which showed comminuted and mildly displaced distal radial fracture.  Patient was placed in a sugar-tong splint and sling.  Patient left foot pain resulted in x-ray which showed oblique mildly displaced distal fifth metatarsal shaft fracture.  Patient was placed in a postop shoe.  Patient was given follow-up information for orthopedics.   PAIN:  Are you having pain? No  PRECAUTIONS: Other: wearing Wrist brace on the RUE.   RED FLAGS: None   WEIGHT BEARING RESTRICTIONS: Yes WBAT for RUE  FALLS: Has patient fallen in last 6 months? Yes. Number of falls 1  LIVING ENVIRONMENT: Lives with: lives with their spouse Lives in: House/apartment Stairs: Yes: Internal: flight steps; on right going up and External: 3 steps; on right going up Has following equipment at home: None  PLOF: Independent, Independent with basic ADLs, Independent with household mobility without device, Independent with gait, and Independent with transfers  PATIENT GOALS: feel steadier with walking. Pick feet up batter with walking   OBJECTIVE:  Note: Objective measures were completed at Evaluation unless otherwise noted.  DIAGNOSTIC FINDINGS:   DG Wrist IMPRESSION: Comminuted and mildly displaced distal radial fracture with radiocarpal intra-articular extension.    COGNITION: Overall cognitive status: Within functional limits for tasks assessed   SENSATION: WFL  COORDINATION: ankle to knee: WFL   EDEMA:  WFL  MUSCLE  TONE: WFL   POSTURE: forward head  LOWER EXTREMITY MMT  MMT  Right Eval Left Eval  Hip flexion 4- 4  Hip extension 4 4+  Hip abduction 4 4  Hip adduction 4+ 4+  Hip internal rotation    Hip external rotation    Knee flexion 4 4+  Knee extension 4+ 4+  Ankle dorsiflexion 4+ 4+  Ankle plantarflexion    Ankle inversion    Ankle eversion     (Blank rows = not tested)  LOWER EXTREMITY ROM    Grossly WFL  BED MOBILITY:  Findings: Sit to supine Complete Independence Supine to sit Complete Independence Rolling to Right Complete Independence Rolling to Left Complete Independence  TRANSFERS: Sit to stand: Complete Independence  Assistive device utilized: None     Stand to sit: Complete Independence and Mod A  Assistive device utilized: None     Chair to chair: Complete Independence  Assistive device utilized: None        CURB:  Findings: CGA for safety without UE support   STAIRS:  Findings: Level of Assistance: Modified independence, Stair Negotiation Technique: Step to Pattern Alternating Pattern  with Bilateral Rails, Number of Stairs: 6, Height of Stairs: 6   , and Comments: performed half of descent with step to, then returned to t GAIT: Findings: Gait Characteristics: WFL, step through pattern, and narrow BOS, Distance walked: 70, Assistive device utilized:None, and Level of assistance: Complete Independence  FUNCTIONAL TESTS:  5 times sit to stand: 13.97 sec 6 minute walk test: to be completed  Berg Balance Scale:     Interpretation of scores: Non-Specific Older Adults Cutoff Score: <=22/30 = risk of falls Parkinson's Disease Cutoff score <15/30= fall risk (Hoehn & Yahr 1-4)  Minimally Clinically Important Difference (MCID)  Stroke (acute, subacute, and chronic) = MDC: 4.2 points Vestibular (acute) = MDC: 6 points Community Dwelling Older Adults =  MCID: 4 points Parkinson's Disease  =  MDC: 4.3 points  (Academy of Neurologic Physical Therapy (nd).  Functional Gait Assessment. Retrieved from https://www.neuropt.org/docs/default-source/cpgs/core-outcome-measures/function-gait-assessment-pocket-guide-proof9-(2).pdf?sfvrsn=b79f35043_0.)  PATIENT SURVEYS:  ABC scale: The Activities-Specific Balance Confidence (ABC) Scale 0% 10 20 30  40 50 60 70 80 90 100% No confidence<->completely confident  "How confident are you that you will not lose your balance or become unsteady when you . . .   Date tested   Walk around the house   2. Walk up or down stairs   3. Bend over and pick up a slipper from in front of a closet floor   4. Reach for a small can off a shelf at eye level   5. Stand on tip toes and reach for something above your head   6. Stand on a chair and reach for something   7. Sweep the floor   8. Walk outside the house to a car parked in the driveway   9. Get into or out of a car   10. Walk across a parking lot to the mall   11. Walk up or down a ramp   12. Walk in a crowded mall where people rapidly walk past you   13. Are bumped into by people as you walk through the mall   14. Step onto or off of an escalator while you are holding onto the railing   15. Step onto or off an escalator while holding onto parcels such that you cannot hold onto the railing   16. Walk outside on icy sidewalks   Total: #/16 84%                                                                                                                                 TREATMENT DATE: 02/18/2024 Unless otherwise noted, at least CGA provided & gait belt donned.   Patient demonstrates increased fall risk as noted by score of   52/56 on Berg Balance Scale.  (<36= high risk for falls, close to 100%; 37-45 significant >80%; 46-51 moderate >50%; 52-55 lower >25%)   Patient demonstrates  increased fall risk as noted by score of 23/30 on  Functional Gait Assessment.   <22/30 = predictive of falls, <20/30 = fall in 6 months, <18/30 = predictive of falls in PD MCID: 5 points  stroke population, 4 points geriatric population (ANPTA Core Set of Outcome Measures for Adults with Neurologic Conditions, 2018)   [facility] PT Assessment - 03/02/24 0001       Berg Balance Test   Sit to Stand Able to stand without using hands and stabilize independently    Standing Unsupported Able to stand safely 2 minutes    Sitting with Back Unsupported but Feet Supported on Floor or Stool Able to sit safely and securely 2 minutes    Stand to Sit Sits safely with minimal use of hands    Transfers Able to transfer safely, minor use of hands    Standing Unsupported with Eyes Closed Able to stand 10 seconds safely    Standing Unsupported with Feet Together Able to place feet together independently and stand 1 minute safely    From Standing, Reach Forward with Outstretched Arm Can reach forward >12 cm safely (5)    From Standing Position, Pick up Object from Floor Able to pick up shoe safely and easily    From Standing Position, Turn to Look Behind Over each Shoulder Looks behind one side only/other side shows less weight shift    Turn 360 Degrees Able to turn 360 degrees safely in 4 seconds or less    Standing Unsupported, Alternately Place Feet on Step/Stool Able to stand independently and safely and complete 8 steps in 20 seconds    Standing Unsupported, One Foot in Front Able to plae foot ahead of the other independently and hold 30 seconds    Standing on One Leg Able to lift leg independently and hold 5-10 seconds    Total Score 52      Functional Gait  Assessment   Gait Level Surface Walks 20 ft in less than 5.5 sec, no assistive devices, good speed, no evidence for imbalance, normal gait pattern, deviates no more than 6 in outside of the 12 in walkway width.    Change in Gait Speed Able to smoothly change walking speed without loss of balance or gait deviation. Deviate no more than 6 in outside of the 12 in walkway width.    Gait with Horizontal Head Turns Performs head turns smoothly  with no change in gait. Deviates no more than 6 in outside 12 in walkway width    Gait with Vertical Head Turns Performs head turns with no change in gait. Deviates no more than 6 in outside 12 in walkway width.    Gait and Pivot Turn Pivot turns safely within 3 sec and stops quickly with no loss of balance.    Step Over Obstacle Is able to step over one shoe box (4.5 in total height) without changing gait speed. No evidence of imbalance.    Gait with Narrow Base of Support Ambulates 4-7 steps.    Gait with Eyes Closed Walks 20 ft, slow speed, abnormal gait pattern, evidence for imbalance, deviates 10-15 in outside 12 in walkway width. Requires more than 9 sec to ambulate 20 ft.    Ambulating Backwards Walks 20 ft, uses assistive device, slower speed, mild gait deviations, deviates 6-10 in outside 12 in walkway width.    Steps Alternating feet, must use rail.    Total Score 23  PATIENT EDUCATION: Education details: Art therapist, Education in [employer] Person educated: Patient Education method: Medical illustrator Education comprehension: verbalized understanding  HOME EXERCISE PROGRAM: Access Code: [email or web address] URL: [email or web address] Date: 02/06/2024 Prepared by: Reyes London  Exercises - Sit to Stand Without Arm Support  - 3 x weekly - 3 sets - 10 reps - Standing Tandem Balance with Counter Support  - 3 x weekly - 3 sets - up to 30 sec hold - Standing March  - 3 x weekly - 3 sets - 10 reps - 2 sec hold - Standing Balance in Corner with Eyes Closed  - 2 x weekly - 3 sets - 20 hold  GOALS: Goals reviewed with patient? Yes  SHORT TERM GOALS: Target date: 02/06/2024    Patient will be independent in home exercise program to improve strength/mobility for better functional independence with ADLs. Baseline: to be given at visit 2  Goal status: INITIAL   LONG TERM GOALS: Target date: 03/05/2024    Patient will increase ABC  scale by 5 points % to demonstrate better functional mobility and better confidence with ADLs.  Baseline: 84% 10/14: 83% Goal status: INITIAL  2.  Patient (> 55 years old) will complete five times sit to stand test in < 15 seconds indicating an increased LE strength and improved balance. Baseline:13.7 sec 10/14: 12.58 sec  Goal status: MET  3.  Patient will increase Berg Balance score by > 6 points to demonstrate decreased fall risk during functional activities Baseline: 45/56  10/14: 52 Goal status: MET  4.  Patient will increase 6 min walk test to >1000 feet to improve gait speed and distance for better community ambulation and to reduce fall risk. Baseline: to be assessed at visit 2; 02/06/2024= 875 10/14: 1244ft no AD  Goal status: MET  5.  Patient will increase SLS time to >10 sec bil to improve safety with ADLs and community mobility to reduce fall risk and improve safety with stair management  Baseline: R 2 sec L 4 sec  10/14: R 5 sec L 5 sec  Goal status: IN PROGRESS  6.  Patient will increase FGA score by 4 points  as to demonstrate reduced fall risk and improved dynamic gait balance for better safety with community/home ambulation.  Baseline: 21 10/14: 23/30:  Goal status: IN PROGRESS   ASSESSMENT:  CLINICAL IMPRESSION:  Patient is a 89 y.o. Female who was seen today for physical therapy  treatment for balance deficits with recent traumatic falls. Pt reports that she feels confident to d/c from PT. PT instructed pt in goal assessment. Pt demonstrates improved balance and reduced fall risk  with goals for for improved Berg, 5xSTS, and 6 min walk test. Pt is noted to improve FGA as well indicating reduced fall risk, but goal not met. Pt will continue community  mobility as well as HEP to address generalized balance impairments. No additional PT required at this time.   OBJECTIVE IMPAIRMENTS: Abnormal gait, decreased balance, decreased knowledge of condition, decreased  mobility, difficulty walking, and decreased strength.   ACTIVITY LIMITATIONS: squatting, stairs, locomotion level, and caring for others  PARTICIPATION LIMITATIONS: shopping, community activity, yard work, and church  PERSONAL FACTORS: Age and 1-2 comorbidities: OA and macualar degeneration are also affecting patient's functional outcome.   REHAB POTENTIAL: Good  CLINICAL DECISION MAKING: Evolving/moderate complexity  EVALUATION COMPLEXITY: Moderate  PLAN:  PT FREQUENCY: 1-2x/week  PT DURATION: 12 weeks  PLANNED INTERVENTIONS: [id number]- PT Re-evaluation, 97750- Physical Performance Testing,  97110-Therapeutic exercises, 97530- Therapeutic activity, 97112- Neuromuscular re-education, [phone]- Self Care, [id number]- Manual therapy, [phone]- Gait training, [phone]- Canalith repositioning, Patient/Family education, Balance training, Stair training, Joint mobilization, Joint manipulation, Vestibular training, Visual/preceptual remediation/compensation, DME instructions, Cryotherapy, and Moist heat  PLAN FOR NEXT SESSION:   N.a D/C from PT   Massie FORBES Dollar, PT Chiquita Silvan, SPT Physical Therapy Student - [facility]  03/02/2024, 2:07 PM

## 2024-03-05 ENCOUNTER — Ambulatory Visit: Admitting: Physical Therapy

## 2024-03-09 ENCOUNTER — Ambulatory Visit: Admitting: Physical Therapy

## 2024-03-12 ENCOUNTER — Ambulatory Visit: Admitting: Physical Therapy

## 2024-03-16 ENCOUNTER — Ambulatory Visit: Admitting: Physical Therapy

## 2024-03-19 ENCOUNTER — Ambulatory Visit: Admitting: Physical Therapy

## 2024-03-23 ENCOUNTER — Ambulatory Visit: Admitting: Physical Therapy

## 2024-03-25 ENCOUNTER — Ambulatory Visit: Admitting: Physical Therapy

## 2024-03-30 ENCOUNTER — Ambulatory Visit: Admitting: Physical Therapy

## 2024-04-02 ENCOUNTER — Ambulatory Visit: Admitting: Physical Therapy

## 2024-04-06 ENCOUNTER — Ambulatory Visit

## 2024-04-09 ENCOUNTER — Ambulatory Visit: Admitting: Physical Therapy

## 2024-06-09 ENCOUNTER — Other Ambulatory Visit: Payer: Self-pay | Admitting: Otolaryngology

## 2024-06-09 DIAGNOSIS — R053 Chronic cough: Secondary | ICD-10-CM

## 2024-06-09 DIAGNOSIS — R1310 Dysphagia, unspecified: Secondary | ICD-10-CM

## 2024-06-18 ENCOUNTER — Ambulatory Visit

## 2024-06-25 ENCOUNTER — Ambulatory Visit

## 2024-07-01 ENCOUNTER — Ambulatory Visit

## 2024-09-17 ENCOUNTER — Encounter (INDEPENDENT_AMBULATORY_CARE_PROVIDER_SITE_OTHER): Admitting: Ophthalmology
